# Patient Record
Sex: Female | Born: 1995 | Race: Black or African American | Hispanic: No | Marital: Single
Health system: Midwestern US, Community
[De-identification: ages and names within clinical notes are randomized; demographics above are authoritative.]

## PROBLEM LIST (undated history)

## (undated) ENCOUNTER — Inpatient Hospital Stay (HOSPITAL_COMMUNITY): Payer: Self-pay

## (undated) DIAGNOSIS — K8020 Calculus of gallbladder without cholecystitis without obstruction: Secondary | ICD-10-CM

## (undated) DIAGNOSIS — D649 Anemia, unspecified: Secondary | ICD-10-CM

## (undated) HISTORY — PX: CHOLECYSTECTOMY: SHX55

---

## 2015-09-18 ENCOUNTER — Encounter (HOSPITAL_COMMUNITY): Payer: Self-pay | Admitting: Emergency Medicine

## 2015-09-18 ENCOUNTER — Emergency Department (HOSPITAL_COMMUNITY): Payer: Medicaid Other

## 2015-09-18 ENCOUNTER — Inpatient Hospital Stay (HOSPITAL_COMMUNITY)
Admission: EM | Admit: 2015-09-18 | Discharge: 2015-09-22 | DRG: 446 | Disposition: A | Discharge status: Home / Self Care | Payer: Medicaid Other | Attending: Internal Medicine | Admitting: Internal Medicine

## 2015-09-18 ENCOUNTER — Emergency Department (HOSPITAL_COMMUNITY)
Admission: EM | Admit: 2015-09-18 | Discharge: 2015-09-18 | Disposition: A | Discharge status: Home / Self Care | Payer: Medicaid Other | Source: Home / Self Care | Attending: Emergency Medicine | Admitting: Emergency Medicine

## 2015-09-18 DIAGNOSIS — Z791 Long term (current) use of non-steroidal anti-inflammatories (NSAID): Secondary | ICD-10-CM

## 2015-09-18 DIAGNOSIS — Y92230 Patient room in hospital as the place of occurrence of the external cause: Secondary | ICD-10-CM | POA: Diagnosis present

## 2015-09-18 DIAGNOSIS — R1013 Epigastric pain: Secondary | ICD-10-CM | POA: Insufficient documentation

## 2015-09-18 DIAGNOSIS — D649 Anemia, unspecified: Secondary | ICD-10-CM | POA: Diagnosis present

## 2015-09-18 DIAGNOSIS — R1010 Upper abdominal pain, unspecified: Secondary | ICD-10-CM

## 2015-09-18 DIAGNOSIS — Z6833 Body mass index (BMI) 33.0-33.9, adult: Secondary | ICD-10-CM

## 2015-09-18 DIAGNOSIS — R1011 Right upper quadrant pain: Secondary | ICD-10-CM

## 2015-09-18 DIAGNOSIS — R7989 Other specified abnormal findings of blood chemistry: Secondary | ICD-10-CM | POA: Diagnosis present

## 2015-09-18 DIAGNOSIS — Z9049 Acquired absence of other specified parts of digestive tract: Secondary | ICD-10-CM

## 2015-09-18 DIAGNOSIS — Z79899 Other long term (current) drug therapy: Secondary | ICD-10-CM

## 2015-09-18 DIAGNOSIS — K5900 Constipation, unspecified: Secondary | ICD-10-CM | POA: Diagnosis present

## 2015-09-18 DIAGNOSIS — K831 Obstruction of bile duct: Secondary | ICD-10-CM

## 2015-09-18 DIAGNOSIS — L298 Other pruritus: Secondary | ICD-10-CM | POA: Diagnosis not present

## 2015-09-18 DIAGNOSIS — Z590 Homelessness: Secondary | ICD-10-CM

## 2015-09-18 DIAGNOSIS — K760 Fatty (change of) liver, not elsewhere classified: Secondary | ICD-10-CM | POA: Diagnosis present

## 2015-09-18 DIAGNOSIS — R109 Unspecified abdominal pain: Secondary | ICD-10-CM | POA: Diagnosis present

## 2015-09-18 DIAGNOSIS — R945 Abnormal results of liver function studies: Secondary | ICD-10-CM

## 2015-09-18 DIAGNOSIS — T361X5A Adverse effect of cephalosporins and other beta-lactam antibiotics, initial encounter: Secondary | ICD-10-CM | POA: Diagnosis not present

## 2015-09-18 DIAGNOSIS — Z881 Allergy status to other antibiotic agents status: Secondary | ICD-10-CM

## 2015-09-18 DIAGNOSIS — K8070 Calculus of gallbladder and bile duct without cholecystitis without obstruction: Principal | ICD-10-CM | POA: Diagnosis present

## 2015-09-18 HISTORY — DX: Calculus of gallbladder without cholecystitis without obstruction: K80.20

## 2015-09-18 LAB — CBC WITH DIFFERENTIAL/PLATELET
BASOS ABS: 0 10*3/uL (ref 0.0–0.1)
BASOS PCT: 0 %
Basophils Absolute: 0 10*3/uL (ref 0.0–0.1)
Basophils Relative: 0 %
EOS ABS: 0 10*3/uL (ref 0.0–0.7)
EOS ABS: 0 10*3/uL (ref 0.0–0.7)
EOS PCT: 1 %
Eosinophils Relative: 0 %
HCT: 34.3 % — ABNORMAL LOW (ref 36.0–46.0)
HCT: 33.2 % — ABNORMAL LOW (ref 36.0–46.0)
Hemoglobin: 11 g/dL — ABNORMAL LOW (ref 12.0–15.0)
Hemoglobin: 11 g/dL — ABNORMAL LOW (ref 12.0–15.0)
LYMPHS ABS: 1.3 10*3/uL (ref 0.7–4.0)
LYMPHS PCT: 17 %
Lymphocytes Relative: 17 %
Lymphs Abs: 1 10*3/uL (ref 0.7–4.0)
MCH: 25.9 pg — AB (ref 26.0–34.0)
MCH: 25.9 pg — ABNORMAL LOW (ref 26.0–34.0)
MCHC: 32.1 g/dL (ref 30.0–36.0)
MCHC: 33.1 g/dL (ref 30.0–36.0)
MCV: 80.7 fL (ref 78.0–100.0)
MCV: 78.3 fL (ref 78.0–100.0)
MONO ABS: 0.7 10*3/uL (ref 0.1–1.0)
MONO ABS: 0.3 10*3/uL (ref 0.1–1.0)
MONOS PCT: 9 %
Monocytes Relative: 6 %
NEUTROS ABS: 4.4 10*3/uL (ref 1.7–7.7)
NEUTROS PCT: 77 %
Neutro Abs: 5.7 10*3/uL (ref 1.7–7.7)
Neutrophils Relative %: 73 %
PLATELETS: 381 10*3/uL (ref 150–400)
PLATELETS: 376 10*3/uL (ref 150–400)
RBC: 4.25 MIL/uL (ref 3.87–5.11)
RBC: 4.24 MIL/uL (ref 3.87–5.11)
RDW: 14.2 % (ref 11.5–15.5)
RDW: 14 % (ref 11.5–15.5)
WBC: 7.8 10*3/uL (ref 4.0–10.5)
WBC: 5.7 10*3/uL (ref 4.0–10.5)

## 2015-09-18 LAB — URINE MICROSCOPIC-ADD ON: RBC / HPF: NONE SEEN RBC/hpf (ref 0–5)

## 2015-09-18 LAB — URINALYSIS, ROUTINE W REFLEX MICROSCOPIC
GLUCOSE, UA: NEGATIVE mg/dL
Hgb urine dipstick: NEGATIVE
KETONES UR: NEGATIVE mg/dL
NITRITE: NEGATIVE
PROTEIN: NEGATIVE mg/dL
Specific Gravity, Urine: 1.019 (ref 1.005–1.030)
pH: 7.5 (ref 5.0–8.0)

## 2015-09-18 LAB — LIPASE, BLOOD
LIPASE: 38 U/L (ref 11–51)
LIPASE: 24 U/L (ref 11–51)

## 2015-09-18 LAB — COMPREHENSIVE METABOLIC PANEL
ALBUMIN: 4.4 g/dL (ref 3.5–5.0)
ALT: 206 U/L — ABNORMAL HIGH (ref 14–54)
ALT: 742 U/L — ABNORMAL HIGH (ref 14–54)
ANION GAP: 8 (ref 5–15)
ANION GAP: 7 (ref 5–15)
AST: 407 U/L — ABNORMAL HIGH (ref 15–41)
AST: 771 U/L — AB (ref 15–41)
Albumin: 4.6 g/dL (ref 3.5–5.0)
Alkaline Phosphatase: 87 U/L (ref 38–126)
Alkaline Phosphatase: 114 U/L (ref 38–126)
BUN: 14 mg/dL (ref 6–20)
BUN: 8 mg/dL (ref 6–20)
CHLORIDE: 106 mmol/L (ref 101–111)
CHLORIDE: 108 mmol/L (ref 101–111)
CO2: 23 mmol/L (ref 22–32)
CO2: 21 mmol/L — ABNORMAL LOW (ref 22–32)
Calcium: 8.9 mg/dL (ref 8.9–10.3)
Calcium: 8.8 mg/dL — ABNORMAL LOW (ref 8.9–10.3)
Creatinine, Ser: 0.66 mg/dL (ref 0.44–1.00)
Creatinine, Ser: 0.58 mg/dL (ref 0.44–1.00)
GFR calc Af Amer: >60 mL/min (ref >60)
GFR calc non Af Amer: >60 mL/min (ref >60)
GFR calc non Af Amer: >60 mL/min (ref >60)
GLUCOSE: 88 mg/dL (ref 65–99)
Glucose, Bld: 112 mg/dL — ABNORMAL HIGH (ref 65–99)
POTASSIUM: 3.7 mmol/L (ref 3.5–5.1)
POTASSIUM: 3.7 mmol/L (ref 3.5–5.1)
SODIUM: 137 mmol/L (ref 135–145)
Sodium: 136 mmol/L (ref 135–145)
Total Bilirubin: 0.8 mg/dL (ref 0.3–1.2)
Total Bilirubin: 3.5 mg/dL — ABNORMAL HIGH (ref 0.3–1.2)
Total Protein: 8.2 g/dL — ABNORMAL HIGH (ref 6.5–8.1)
Total Protein: 7.9 g/dL (ref 6.5–8.1)

## 2015-09-18 LAB — I-STAT BETA HCG BLOOD, ED (MC, WL, AP ONLY)

## 2015-09-18 MED ORDER — ONDANSETRON HCL 4 MG/2ML IJ SOLN
4.0000 mg | Freq: Once | INTRAMUSCULAR | Status: AC
  Administered 2015-09-18: 4 mg via INTRAVENOUS
  Filled 2015-09-18: qty 2

## 2015-09-18 MED ORDER — SODIUM CHLORIDE 0.9 % IV BOLUS (SEPSIS)
1000.0000 mL | Freq: Once | INTRAVENOUS | Status: AC
  Administered 2015-09-18: 1000 mL via INTRAVENOUS

## 2015-09-18 MED ORDER — ONDANSETRON HCL 4 MG/2ML IJ SOLN
4.0000 mg | Freq: Four times a day (QID) | INTRAMUSCULAR | Status: DC | PRN
  Administered 2015-09-19 – 2015-09-20 (×3): 4 mg via INTRAVENOUS
  Filled 2015-09-18 (×3): qty 2

## 2015-09-18 MED ORDER — EPINEPHRINE 0.3 MG/0.3ML IJ SOAJ
INTRAMUSCULAR | Status: AC
  Administered 2015-09-18: 0.3 mg
  Filled 2015-09-18: qty 0.3

## 2015-09-18 MED ORDER — FAMOTIDINE IN NACL 20-0.9 MG/50ML-% IV SOLN
20.0000 mg | Freq: Two times a day (BID) | INTRAVENOUS | Status: DC
  Administered 2015-09-18 – 2015-09-22 (×8): 20 mg via INTRAVENOUS
  Filled 2015-09-18 (×9): qty 50

## 2015-09-18 MED ORDER — MORPHINE SULFATE (PF) 2 MG/ML IV SOLN
2.0000 mg | INTRAVENOUS | Status: DC | PRN
  Administered 2015-09-19 (×2): 2 mg via INTRAVENOUS
  Filled 2015-09-18 (×2): qty 1

## 2015-09-18 MED ORDER — RANITIDINE HCL 150 MG PO TABS: 150.0000 mg | ORAL_TABLET | Freq: Two times a day (BID) | ORAL | Status: DC

## 2015-09-18 MED ORDER — DEXTROSE-NACL 5-0.45 % IV SOLN
INTRAVENOUS | Status: DC
  Administered 2015-09-18 – 2015-09-22 (×4): via INTRAVENOUS

## 2015-09-18 MED ORDER — EPINEPHRINE 0.3 MG/0.3ML IJ SOAJ
0.3000 mg | Freq: Once | INTRAMUSCULAR | Status: AC
  Administered 2015-09-18: 0.3 mg via INTRAMUSCULAR

## 2015-09-18 MED ORDER — ONDANSETRON 4 MG PO TBDP: ORAL_TABLET | ORAL | Status: DC

## 2015-09-18 MED ORDER — MORPHINE SULFATE (PF) 4 MG/ML IV SOLN
4.0000 mg | Freq: Once | INTRAVENOUS | Status: AC
  Administered 2015-09-18: 4 mg via INTRAVENOUS
  Filled 2015-09-18: qty 1

## 2015-09-18 MED ORDER — SUCRALFATE 1 G PO TABS: 1.0000 g | ORAL_TABLET | Freq: Four times a day (QID) | ORAL | Status: DC

## 2015-09-18 MED ORDER — IOPAMIDOL (ISOVUE-300) INJECTION 61%
100.0000 mL | Freq: Once | INTRAVENOUS | Status: AC | PRN
Start: 2015-09-18 — End: 2015-09-18
  Administered 2015-09-18: 100 mL via INTRAVENOUS

## 2015-09-18 MED ORDER — LEVOFLOXACIN IN D5W 750 MG/150ML IV SOLN
750.0000 mg | INTRAVENOUS | Status: DC
  Administered 2015-09-19 – 2015-09-20 (×2): 750 mg via INTRAVENOUS
  Filled 2015-09-18 (×2): qty 150

## 2015-09-18 MED ORDER — GI COCKTAIL ~~LOC~~
30.0000 mL | Freq: Once | ORAL | Status: AC
  Administered 2015-09-18: 30 mL via ORAL
  Filled 2015-09-18: qty 30

## 2015-09-18 MED ORDER — DEXTROSE 5 % IV SOLN
1.0000 g | Freq: Once | INTRAVENOUS | Status: AC
  Administered 2015-09-18: 1 g via INTRAVENOUS
  Filled 2015-09-18: qty 10

## 2015-09-18 MED ORDER — ENOXAPARIN SODIUM 40 MG/0.4ML ~~LOC~~ SOLN
40.0000 mg | Freq: Every day | SUBCUTANEOUS | Status: DC
  Administered 2015-09-18 – 2015-09-19 (×2): 40 mg via SUBCUTANEOUS
  Filled 2015-09-18 (×3): qty 0.4

## 2015-09-18 MED ORDER — DIPHENHYDRAMINE HCL 50 MG/ML IJ SOLN
INTRAMUSCULAR | Status: AC
  Administered 2015-09-18: 25 mg
  Filled 2015-09-18: qty 1

## 2015-09-18 MED ORDER — METHYLPREDNISOLONE SODIUM SUCC 125 MG IJ SOLR
125.0000 mg | Freq: Once | INTRAMUSCULAR | Status: AC
  Administered 2015-09-18: 125 mg via INTRAVENOUS
  Filled 2015-09-18: qty 2

## 2015-09-18 MED ORDER — SODIUM CHLORIDE 0.9 % IV SOLN: Freq: Once | INTRAVENOUS | Status: DC

## 2015-09-18 MED ORDER — MORPHINE SULFATE (PF) 4 MG/ML IV SOLN
8.0000 mg | Freq: Once | INTRAVENOUS | Status: AC
  Administered 2015-09-18: 8 mg via INTRAVENOUS
  Filled 2015-09-18: qty 2

## 2015-09-18 MED ORDER — DIATRIZOATE MEGLUMINE & SODIUM 66-10 % PO SOLN
15.0000 mL | Freq: Once | ORAL | Status: AC
  Administered 2015-09-18: 15 mL via ORAL

## 2015-09-18 MED ORDER — FAMOTIDINE IN NACL 20-0.9 MG/50ML-% IV SOLN
20.0000 mg | Freq: Once | INTRAVENOUS | Status: AC
  Administered 2015-09-18: 20 mg via INTRAVENOUS
  Filled 2015-09-18: qty 50

## 2015-09-18 NOTE — ED Notes (Signed)
AD at bedside. Pt still refusing to leave at this time. Pt homeless and concerned about having no place to go. Pt given prescription for zantac per request. Social work consulted.

## 2015-09-18 NOTE — ED Notes (Addendum)
Pt transported from relatives home by EMS with c/o epigastric pain and n/v. Per EMS pt states last time this happened she has gall bladder removed  Pt awoke at 3 with pain radiating to back 10/10, pt has husband and small child in carrier at bedside

## 2015-09-18 NOTE — Progress Notes (Signed)
Pharmacy Antibiotic Note  Margaret Jones is a 20 y.o. female admitted on 09/18/2015 with RUQ/epigastric pain with N/V. Patient received Ceftriaxone 1gm IV x 1 dose in ED.  Pharmacy has been consulted for Levofloxacin dosing for possible cholangitis.  Plan for GI eval in the AM  Plan: Levofloxacin 750mg  IV q24h  Height: 5\' 2"  (157.5 cm) Weight: 183 lb (83.008 kg) IBW/kg (Calculated) : 50.1  Temp (24hrs), Avg:98.3 F (36.8 C), Min:97.9 F (36.6 C), Max:98.9 F (37.2 C)   Recent Labs Lab 09/18/15 0509 09/18/15 2025  WBC 7.8 5.7  CREATININE 0.66 0.58    Estimated Creatinine Clearance: 110.2 mL/min (by C-G formula based on Cr of 0.58).    Allergies  Allergen Reactions  . Rocephin [Ceftriaxone Sodium In Dextrose] Anaphylaxis    Given Epi 0.3mg     Antimicrobials this admission: 6/19 Ceftriaxone x 1 dose 6/19 Levofloxacin >>    Dose adjustments this admission:    Microbiology results:   BCx:     UCx:      Sputum:      MRSA PCR:    Thank you for allowing pharmacy to be a part of this patient's care.  Maryellen PilePoindexter, Borghild Thaker Trefz, PharmD 09/18/2015 11:25 PM

## 2015-09-18 NOTE — ED Notes (Addendum)
Pt reminded of urine sample, was given pain medicine, would like to wait due to effects of the medicine

## 2015-09-18 NOTE — Discharge Instructions (Signed)
Try zantac 150mg  twice a day.  Follow up with GI for liver enzyme elevation.  Abdominal Pain, Adult Many things can cause abdominal pain. Usually, abdominal pain is not caused by a disease and will improve without treatment. It can often be observed and treated at home. Your health care provider will do a physical exam and possibly order blood tests and X-rays to help determine the seriousness of your pain. However, in many cases, more time must pass before a clear cause of the pain can be found. Before that point, your health care provider may not know if you need more testing or further treatment. HOME CARE INSTRUCTIONS Monitor your abdominal pain for any changes. The following actions may help to alleviate any discomfort you are experiencing:  Only take over-the-counter or prescription medicines as directed by your health care provider.  Do not take laxatives unless directed to do so by your health care provider.  Try a clear liquid diet (broth, tea, or water) as directed by your health care provider. Slowly move to a bland diet as tolerated. SEEK MEDICAL CARE IF:  You have unexplained abdominal pain.  You have abdominal pain associated with nausea or diarrhea.  You have pain when you urinate or have a bowel movement.  You experience abdominal pain that wakes you in the night.  You have abdominal pain that is worsened or improved by eating food.  You have abdominal pain that is worsened with eating fatty foods.  You have a fever. SEEK IMMEDIATE MEDICAL CARE IF:  Your pain does not go away within 2 hours.  You keep throwing up (vomiting).  Your pain is felt only in portions of the abdomen, such as the right side or the left lower portion of the abdomen.  You pass bloody or black tarry stools. MAKE SURE YOU:  Understand these instructions.  Will watch your condition.  Will get help right away if you are not doing well or get worse.   This information is not intended to  replace advice given to you by your health care provider. Make sure you discuss any questions you have with your health care provider.   Document Released: 12/26/2004 Document Revised: 12/07/2014 Document Reviewed: 11/25/2012 Elsevier Interactive Patient Education [employer]2016 Elsevier Inc.

## 2015-09-18 NOTE — ED Notes (Signed)
Pt now requesting social work consult.

## 2015-09-18 NOTE — ED Notes (Signed)
Attempted an IV x 2 but was unsuccessful. Will await for another to attempt.

## 2015-09-18 NOTE — ED Notes (Signed)
EDP at bedside  

## 2015-09-18 NOTE — ED Provider Notes (Addendum)
CSN: [id number]     Arrival date & time 09/18/15  1841 History   First MD Initiated Contact with Patient 09/18/15 1948     Chief Complaint  Patient presents with  . Abdominal Pain     (Consider location/radiation/quality/duration/timing/severity/associated sxs/prior Treatment) HPI  20 year old female presents with upper abdominal pain, nausea, and vomiting. Started around 3 AM. She's had pain like this on and off for the last several months. [place] in [place]. Recently moved down to this area and had her gallbladder taken out about 1-1/2 months ago. This was taken around [place]. Has not had any pain since until this morning. Was seen here, had labs, given morphine, and had a negative right upper quadrant ultrasound. Had some LFT elevation. Denies alcohol use. Patient states that she still in severe pain and is still vomiting. No diarrhea. Last bowel movement was yesterday and normal. No urinary symptoms. Pain radiates to her back. Called referral GI but can't be seen until August.   Past Medical History  Diagnosis Date  . Gall stones    Past Surgical History  Procedure Laterality Date  . Cholecystectomy    . Cesarean section     No family history on file. Social History  Substance Use Topics  . Smoking status: Never Smoker   . Smokeless tobacco: None  . Alcohol Use: No   OB History    No data available     Review of Systems  Constitutional: Negative for fever.  Gastrointestinal: Positive for nausea, vomiting and abdominal pain. Negative for diarrhea, constipation and blood in stool.  Genitourinary: Negative for dysuria and hematuria.  Musculoskeletal: Positive for back pain.  All other systems reviewed and are negative.     Allergies  Review of patient's allergies indicates no known allergies.  Home Medications   Prior to Admission medications   Medication Sig Start Date End Date Taking? Authorizing Provider  ibuprofen (ADVIL,MOTRIN) 400  MG tablet Take 400 mg by mouth every 6 (six) hours as needed for mild pain.    Historical Provider, MD  ondansetron (ZOFRAN ODT) 4 MG disintegrating tablet 4mg  ODT q4 hours prn nausea/vomit 09/18/15   Melene Planan Floyd, DO  ranitidine (ZANTAC) 150 MG tablet Take 1 tablet (150 mg total) by mouth 2 (two) times daily. 09/18/15   Lorre NickAnthony Allen, MD  sucralfate (CARAFATE) 1 g tablet Take 1 tablet (1 g total) by mouth 4 (four) times daily. 09/18/15   Lorre NickAnthony Allen, MD   BP 111/67 mmHg  Pulse 66  Temp(Src) 97.9 F (36.6 C) (Oral)  Resp 18  Ht 5\' 2"  (1.575 m)  Wt 183 lb (83.008 kg)  BMI 33.46 kg/m2  SpO2 100%  LMP 08/30/2015 Physical Exam  Constitutional: She is oriented to person, place, and time. She appears well-developed and well-nourished. No distress.  HENT:  Head: Normocephalic and atraumatic.  Right Ear: External ear normal.  Left Ear: External ear normal.  Nose: Nose normal.  Eyes: Right eye exhibits no discharge. Left eye exhibits no discharge.  Cardiovascular: Normal rate, regular rhythm and normal heart sounds.   Pulmonary/Chest: Effort normal and breath sounds normal.  Abdominal: Soft. There is tenderness in the right upper quadrant, epigastric area and left upper quadrant.  Neurological: She is alert and oriented to person, place, and time.  Skin: Skin is warm and dry. She is not diaphoretic.  Nursing note and vitals reviewed.   ED Course  Procedures (including critical care time) Labs Review Labs Reviewed  COMPREHENSIVE METABOLIC  PANEL - Abnormal; Notable for the following:    CO2 21 (*)    Calcium 8.8 (*)    AST 771 (*)    ALT 742 (*)    Total Bilirubin 3.5 (*)    All other components within normal limits  CBC WITH DIFFERENTIAL/PLATELET - Abnormal; Notable for the following:    Hemoglobin 11.0 (*)    HCT 33.2 (*)    MCH 25.9 (*)    All other components within normal limits  URINALYSIS, ROUTINE W REFLEX MICROSCOPIC (NOT AT [facility]) - Abnormal; Notable for the following:     Color, Urine AMBER (*)    APPearance CLOUDY (*)    Bilirubin Urine SMALL (*)    Leukocytes, UA SMALL (*)    All other components within normal limits  URINE MICROSCOPIC-ADD ON - Abnormal; Notable for the following:    Squamous Epithelial / LPF 0-5 (*)    Bacteria, UA FEW (*)    All other components within normal limits  LIPASE, BLOOD    Imaging Review Ct Abdomen Pelvis W Contrast  09/18/2015  CLINICAL DATA:  Awoke at 3 p.m. with epigastric pain, pain radiating to back rated at 10/10, upper abdominal pain, nausea, vomiting, prior cholecystectomy 2 months ago, initial encounter EXAM: CT ABDOMEN AND PELVIS WITH CONTRAST TECHNIQUE: Multidetector CT imaging of the abdomen and pelvis was performed using the standard protocol following bolus administration of intravenous contrast. Sagittal and coronal MPR images reconstructed from axial data set. CONTRAST:  ISOVUE-300 IOPAMIDOL (ISOVUE-300) INJECTION 61% IV. Dilute oral contrast. COMPARISON:  None; correlation ultrasound abdomen 09/18/2015 FINDINGS: Lower chest:  Lung bases clear Hepatobiliary: Diffuse fatty infiltration of liver. Post cholecystectomy. Mildly prominent CBD and central intrahepatic biliary radicles, CBD 7 mm proximally and 8 mm distal, question due to cholecystectomy. No definite CBD stone by CT. No abnormal fluid collection at gallbladder fossa. Pancreas: Normal appearance Spleen: Normal appearance Adrenals/Urinary Tract: Adrenal glands normal appearance. Kidneys normal appearance. Ureters normal appearance. Bladder wall appears thickened though bladder is underdistended at least in part artifactual. Stomach/Bowel: Normal appendix. Stomach and bowel loops normal appearance. Vascular/Lymphatic: Vascular structures unremarkable. No adenopathy. Reproductive: Uterus and LEFT adnexa normal appearance. Tiny cyst RIGHT ovary. Small amount of nonspecific low-attenuation free pelvic fluid. Other: No mass, free air, or hernia. Musculoskeletal:  Bones unremarkable. IMPRESSION: Diffuse fatty infiltration of liver. Mild extrahepatic and central intrahepatic biliary dilatation post cholecystectomy, recommend correlation with LFTs. Thickened bladder wall, likely at least partly due to underdistention though cannot exclude other causes of bladder wall thickening including infection and chronic outlet obstruction, tumor unlikely ; recommend correlation with urinalysis. Small amount of free pelvic fluid of uncertain etiology. Electronically Signed   By: Ulyses Southward M.D.   On: 09/18/2015 21:39   US Abdomen Limited Ruq  09/18/2015  CLINICAL DATA:  Chest pain. EXAM: US ABDOMEN LIMITED - RIGHT UPPER QUADRANT COMPARISON:  No prior. FINDINGS: Gallbladder: Cholecystectomy. Common bile duct: Diameter: 6.8 mm. Liver: Liver is echogenic consistent fatty infiltration and/or hepatocellular disease. IMPRESSION: 1. Cholecystectomy.  No biliary distention. 2. Echogenic liver consistent with fatty infiltration and/or hepatocellular disease. Electronically Signed   By: Maisie Fus  Register   On: 09/18/2015 07:38   I have personally reviewed and evaluated these images and lab results as part of my medical decision-making.   EKG Interpretation None      MDM   Final diagnoses:  Upper abdominal pain  Abnormal LFTs    Patient's LFTs are worsening and now her bilirubin is at  3.5. CT shows mildly dilated CBD and intrahepatic ducts. Pain better with IV morphine. Discussed with Dr. Evette Cristal of GI who recommends admission to medicine and MRCP in the morning. Cover with Rocephin. If there is a common bile duct stone likely will need EUS or ERCP. Patient is currently stable and does not appear septic. Admit to the hospitalist.    Pricilla Loveless, MD 09/18/15 2243  Of note patient started developing itching, periorbital swelling, nasal congestion, and throat itching shortly after starting Rocephin. She has never had rocephin for allergy documented before. She was given  Benadryl but also epinephrine given the throat symptoms. She is feeling better and the itching and swelling is improving. Hospitalist at bedside and aware.  Pricilla Loveless, MD 09/18/15 [phone]

## 2015-09-18 NOTE — Progress Notes (Signed)
Nurse spoke with CSW regarding patient needing assistance with shelter. Nurse asked for a shelter list to provide to patient. Nurse stated patient has stated [facility]. Nurse was given shelter list.  Elenore PaddyLaVonia Huma Imhoff, LCSWA [phone] ED CSW 09/18/2015 10:04 AM

## 2015-09-18 NOTE — ED Provider Notes (Signed)
Patient signed out to me by Dr. Adela LankFloyd and ultrasound results reviewed with patient and she was instructed to follow-up with her gastroenterologist  Margaret NickAnthony [name], MD 09/18/15 [phone]

## 2015-09-18 NOTE — H&P (Signed)
History and Physical  Margaret Jones ZOX:[id number] DOB: [birth year] DOA: 09/18/2015  PCP:  No PCP Per Patient   Chief Complaint:  Abdominal pain   History of Present Illness:  Patient is a 20 yo female with history of cholecystectomy 3 months ago comes with cc of abdominal pain in epigastric/RUQ area, radiating to her chest sometimes, associated with nausea and vomiting started earlier this morning with no diarrhea, fever or chills. She has no dyspnea or cough. No other complaints.   Review of Systems:  CONSTITUTIONAL:     No night sweats.  No fatigue.  No fever. No chills. Eyes:                            No visual changes.  No eye pain.  No eye discharge.   ENT:                              No epistaxis.  No sinus pain.  No sore throat.   No congestion. RESPIRATORY:           No cough.  No wheeze.  No hemoptysis.  No dyspnea CARDIOVASCULAR   :  +chest pains.  No palpitations. GASTROINTESTINAL:  +abdominal pain.  +nausea. +vomiting.  No diarrhea. No constipation.  No hematemesis.  No hematochezia.  No melena. GENITOURINARY:      No urgency.  No frequency.  No dysuria.  No hematuria.  No  obstructive symptoms.  No discharge.  No pain.  MUSCULOSKELETAL:  No musculoskeletal pain.  No joint swelling.  No arthritis. NEUROLOGICAL:        No confusion.  No weakness. No headache. No seizure. PSYCHIATRIC:             No depression. No anxiety. No suicidal ideation. SKIN:                             No rashes.  No lesions.  No wounds. ENDOCRINE:                No weight loss.  No polydipsia.  No polyuria.  No polyphagia. HEMATOLOGIC:           No purpura.  No petechiae.  No bleeding.  ALLERGIC                 : No pruritus.  No angioedema Other:  Past Medical and Surgical History:   Past Medical History  Diagnosis Date  . Gall stones    Past Surgical History  Procedure Laterality Date  . Cholecystectomy    . Cesarean section      Social History:   reports that she has never  smoked. She does not have any smokeless tobacco history on file. She reports that she does not drink alcohol or use illicit drugs.    Allergies  Allergen Reactions  . Rocephin [Ceftriaxone Sodium In Dextrose] Anaphylaxis    Given Epi 0.3mg     No family history on file.    Prior to Admission medications   Medication Sig Start Date End Date Taking? Authorizing Provider  ibuprofen (ADVIL,MOTRIN) 400 MG tablet Take 400 mg by mouth every 6 (six) hours as needed for mild pain.    Historical Provider, MD  ondansetron (ZOFRAN ODT) 4 MG disintegrating tablet 4mg  ODT q4 hours prn nausea/vomit 09/18/15   Melene Planan Floyd, DO  ranitidine (  ZANTAC) 150 MG tablet Take 1 tablet (150 mg total) by mouth 2 (two) times daily. 09/18/15   Lorre Nick, MD  sucralfate (CARAFATE) 1 g tablet Take 1 tablet (1 g total) by mouth 4 (four) times daily. 09/18/15   Lorre Nick, MD    Physical Exam: BP 115/58 mmHg  Pulse 70  Temp(Src) 98 F (36.7 C) (Oral)  Resp 18  Ht 5\' 2"  (1.575 m)  Wt 83.008 kg (183 lb)  BMI 33.46 kg/m2  SpO2 100%  LMP 08/30/2015  GENERAL :   Alert and cooperative, and appears to be in no acute distress. HEAD:           normocephalic. EYES:            PERRL, EOMI.  vision is grossly intact. EARS:           hearing grossly intact. NOSE:           No nasal discharge. THROAT:     Oral cavity and pharynx normal.   NECK:          supple, non-tender.  CARDIAC:    Normal S1 and S2. No gallop. No murmurs.  Vascular:     no peripheral edema.  LUNGS:       Clear to auscultation  ABDOMEN: Positive bowel sounds. Soft, nondistended, RUQ/Epigastric tenderness with palpation. No guarding or rebound.      MSK:           No joint erythema or tenderness. Normal muscular development. EXT           : No significant deformity or joint abnormality. Neuro        : Alert, oriented to person, place, and time.                      CN II-XII intact.                       Strength and sensation symmetric and intact  throughout.                       SKIN:            No rash. No lesions. PSYCH:       No hallucination. Patient is not suicidal.          Labs on Admission:  Reviewed.   Radiological Exams on Admission: Ct Abdomen Pelvis W Contrast  09/18/2015  CLINICAL DATA:  Awoke at 3 p.m. with epigastric pain, pain radiating to back rated at 10/10, upper abdominal pain, nausea, vomiting, prior cholecystectomy 2 months ago, initial encounter EXAM: CT ABDOMEN AND PELVIS WITH CONTRAST TECHNIQUE: Multidetector CT imaging of the abdomen and pelvis was performed using the standard protocol following bolus administration of intravenous contrast. Sagittal and coronal MPR images reconstructed from axial data set. CONTRAST:  ISOVUE-300 IOPAMIDOL (ISOVUE-300) INJECTION 61% IV. Dilute oral contrast. COMPARISON:  None; correlation ultrasound abdomen 09/18/2015 FINDINGS: Lower chest:  Lung bases clear Hepatobiliary: Diffuse fatty infiltration of liver. Post cholecystectomy. Mildly prominent CBD and central intrahepatic biliary radicles, CBD 7 mm proximally and 8 mm distal, question due to cholecystectomy. No definite CBD stone by CT. No abnormal fluid collection at gallbladder fossa. Pancreas: Normal appearance Spleen: Normal appearance Adrenals/Urinary Tract: Adrenal glands normal appearance. Kidneys normal appearance. Ureters normal appearance. Bladder wall appears thickened though bladder is underdistended at least in part artifactual. Stomach/Bowel: Normal appendix. Stomach and bowel loops normal  appearance. Vascular/Lymphatic: Vascular structures unremarkable. No adenopathy. Reproductive: Uterus and LEFT adnexa normal appearance. Tiny cyst RIGHT ovary. Small amount of nonspecific low-attenuation free pelvic fluid. Other: No mass, free air, or hernia. Musculoskeletal: Bones unremarkable. IMPRESSION: Diffuse fatty infiltration of liver. Mild extrahepatic and central intrahepatic biliary dilatation post cholecystectomy,  recommend correlation with LFTs. Thickened bladder wall, likely at least partly due to underdistention though cannot exclude other causes of bladder wall thickening including infection and chronic outlet obstruction, tumor unlikely ; recommend correlation with urinalysis. Small amount of free pelvic fluid of uncertain etiology. Electronically Signed   By: Ulyses Southward M.D.   On: 09/18/2015 21:39   US Abdomen Limited Ruq  09/18/2015  CLINICAL DATA:  Chest pain. EXAM: US ABDOMEN LIMITED - RIGHT UPPER QUADRANT COMPARISON:  No prior. FINDINGS: Gallbladder: Cholecystectomy. Common bile duct: Diameter: 6.8 mm. Liver: Liver is echogenic consistent fatty infiltration and/or hepatocellular disease. IMPRESSION: 1. Cholecystectomy.  No biliary distention. 2. Echogenic liver consistent with fatty infiltration and/or hepatocellular disease. Electronically Signed   By: Maisie Fus  Register   On: 09/18/2015 07:38      Assessment/Plan  RUQ/Epigastric pain/nausea/vomiting/elevated liver enzymes/hyperbilirubinemia: Unclear etiology, could be due to mass/stricture/gallstones/billiary sludge Will check MRCP and consult GI in am Keep NPO, morphine prn, zofran prn, pepcid IV, IVF Will start on levoquin for possible cholangitis although unlikely awaiting MRI /GI eval.     Input & Output: NA Lines & Tubes: PIV DVT prophylaxis: [place] enoxaparin  GI prophylaxis: Pepcid Consultants: GI Code Status: Full Family Communication: none at bedside  Disposition Plan: Obs    Eston Esters M.D [facility]

## 2015-09-18 NOTE — ED Provider Notes (Signed)
CSN: [id number]     Arrival date & time 09/18/15  [id number] History   First MD Initiated Contact with Patient 09/18/15 [phone]     Chief Complaint  Patient presents with  . Emesis     (Consider location/radiation/quality/duration/timing/severity/associated sxs/prior Treatment) Patient is a 20 y.o. female presenting with vomiting.  Emesis Severity:  Moderate Duration:  4 hours Timing:  Constant Progression:  Worsening Chronicity:  New Recent urination:  Normal Relieved by:  Nothing Worsened by:  Nothing tried Ineffective treatments:  None tried Associated symptoms: abdominal pain   Associated symptoms: no arthralgias, no chills, no headaches and no myalgias    20 yo F With a chief complaint of Epigastric abdominal pain. The started abruptly about 4 hours ago. Patient having some vomiting. She thinks this feels similar to when she had gallstones. She had her gallbladder removed and had cessation of the symptoms. However they've recurred this evening. Unsure what makes it better or worse. Denies fevers or chills. Denies diarrhea.   Past Medical History  Diagnosis Date  . Gall stones    Past Surgical History  Procedure Laterality Date  . Cholecystectomy    . Cesarean section     No family history on file. Social History  Substance Use Topics  . Smoking status: Never Smoker   . Smokeless tobacco: None  . Alcohol Use: No   OB History    No data available     Review of Systems  Constitutional: Negative for fever and chills.  HENT: Negative for congestion and rhinorrhea.   Eyes: Negative for redness and visual disturbance.  Respiratory: Negative for shortness of breath and wheezing.   Cardiovascular: Negative for chest pain and palpitations.  Gastrointestinal: Positive for nausea, vomiting and abdominal pain.  Genitourinary: Negative for dysuria and urgency.  Musculoskeletal: Negative for myalgias and arthralgias.  Skin: Negative for pallor and wound.  Neurological: Negative  for dizziness and headaches.      Allergies  Review of patient's allergies indicates no known allergies.  Home Medications   Prior to Admission medications   Medication Sig Start Date End Date Taking? Authorizing Provider  ibuprofen (ADVIL,MOTRIN) 400 MG tablet Take 400 mg by mouth every 6 (six) hours as needed for mild pain.   Yes Historical Provider, MD  ondansetron (ZOFRAN ODT) 4 MG disintegrating tablet 4mg  ODT q4 hours prn nausea/vomit 09/18/15   Melene Planan Zolton Dowson, DO   BP 113/70 mmHg  Pulse 60  Temp(Src) 98.9 F (37.2 C) (Oral)  Resp 15  Ht 5\' 2"  (1.575 m)  Wt 183 lb (83.008 kg)  BMI 33.46 kg/m2  SpO2 100%  LMP 08/30/2015 Physical Exam  Constitutional: She is oriented to person, place, and time. She appears well-developed and well-nourished. No distress.  HENT:  Head: Normocephalic and atraumatic.  Eyes: EOM are normal. Pupils are equal, round, and reactive to light.  Neck: Normal range of motion. Neck supple.  Cardiovascular: Normal rate and regular rhythm.  Exam reveals no gallop and no friction rub.   No murmur heard. Pulmonary/Chest: Effort normal. She has no wheezes. She has no rales.  Abdominal: Soft. She exhibits no distension. There is tenderness (worst in the epigatrium). There is no rebound and no guarding.  Musculoskeletal: She exhibits no edema or tenderness.  Neurological: She is alert and oriented to person, place, and time.  Skin: Skin is warm and dry. She is not diaphoretic.  Psychiatric: She has a normal mood and affect. Her behavior is normal.  Nursing note and vitals  reviewed.   ED Course  Procedures (including critical care time) Labs Review Labs Reviewed  CBC WITH DIFFERENTIAL/PLATELET - Abnormal; Notable for the following:    Hemoglobin 11.0 (*)    HCT 34.3 (*)    MCH 25.9 (*)    All other components within normal limits  COMPREHENSIVE METABOLIC PANEL - Abnormal; Notable for the following:    Glucose, Bld 112 (*)    Total Protein 8.2 (*)    AST  407 (*)    ALT 206 (*)    All other components within normal limits  LIPASE, BLOOD  URINALYSIS, ROUTINE W REFLEX MICROSCOPIC (NOT AT [facility])  I-STAT BETA HCG BLOOD, ED ([facility], [facility], [facility] ONLY)    Imaging Review No results found. I have personally reviewed and evaluated these images and lab results as part of my medical decision-making.   EKG Interpretation None      MDM   Final diagnoses:  Epigastric pain  RUQ abdominal pain    20 yo F With a chief complaint of nausea and vomiting and epigastric abdominal pain. Will obtain a laboratory evaluation. Treat pain and nausea.   Patient with LFT elevation. Symptoms completely relieved with GI cocktail. With LFT elevation will obtain a right upper quadrant ultrasound.  Old records reviewed. Patient had her gallbladder removed about 2 months ago. This was at [facility]. At times she also had elevated LFTs. Mild common bile duct dilation. At that time the LFTs trended downward and she was discharged home.  I feel if the [place] is negative and symptoms well controlled, will have her follow up with GI as an outpatient for possible further imaging.   Turned over to Dr. Freida Busman.  The patients results and plan were reviewed and discussed.   Any x-rays performed were independently reviewed by myself.   Differential diagnosis were considered with the presenting HPI.  Medications  sodium chloride 0.9 % bolus 1,000 mL (0 mLs Intravenous Stopped 09/18/15 0652)  ondansetron (ZOFRAN) injection 4 mg (4 mg Intravenous Given 09/18/15 0541)  morphine 4 MG/ML injection 4 mg (4 mg Intravenous Given 09/18/15 0540)  gi cocktail (Maalox,Lidocaine,Donnatal) (30 mLs Oral Given 09/18/15 0616)    Filed Vitals:   09/18/15 0405 09/18/15 0634  BP: 115/76 113/70  Pulse: 79 60  Temp: 98.9 F (37.2 C)   TempSrc: Oral   Resp:  15  Height:  (1.575 m)   Weight: 183 lb (83.008 kg)   SpO2: 98% 100%    Final diagnoses:  Epigastric pain  RUQ abdominal  pain       Melene Plan, DO 09/18/15 1610

## 2015-09-18 NOTE — ED Notes (Signed)
Per EMS was here for the same symptoms this am-was given Morphine and was not given amy discharge meds-had gallbladder out 2 months ago

## 2015-09-18 NOTE — ED Notes (Addendum)
Pt refusing to be d/c, sts "I will be back later tonight because you haven't fixed anything."  This RN sat down and spoke with pt. About concerns. Pt stated that she has had this stomach pain, n/v for 6 months and has been to multiple EDs and no one has fixed her problem.  This RN explained that she needs to follow up with her GI doctor whose information is on d/c paperwork. RN explained limitation of the ER as far as being able to adjust medications and follow up with pt. Pt expressed understanding but was still frustrated that she would still be in pain when she got home. Pt denies pain at this time but sts "I know that the pain will be back later and I'm just going to come back here." Pt told this RN she would call GI doctor at this time and make an appointment for today.

## 2015-09-18 NOTE — Progress Notes (Signed)
EDCM spoke to patient at bedside. Patient confirms she does not have a pcp or [place].  [facility], informed patient of services there and walk in times.  EDCM also provided patient with list of pcps who accept self pay patients, list of discount pharmacies and websites needymeds.org and GoodRX.com for medication assistance, phone number to inquire about the orange card, phone number to inquire about Medicaid, phone number to inquire about the Affordable Care Act, financial resources in the community such as local churches, salvation [employer], urban ministries, and dental assistance for uninsured patients.  Patient thankful for resources.  No further EDCM needs at this time.  Patient reports she has just moved to [place].  Patient reports she has applied for Medicaid and is in progress.  [facility] provided patient with phone number to DSS to follow up on application.  [facility] encouraged [facility] to establish care using resources provided.

## 2015-09-19 ENCOUNTER — Observation Stay (HOSPITAL_COMMUNITY): Payer: Medicaid Other

## 2015-09-19 DIAGNOSIS — T361X5A Adverse effect of cephalosporins and other beta-lactam antibiotics, initial encounter: Secondary | ICD-10-CM | POA: Diagnosis not present

## 2015-09-19 DIAGNOSIS — K760 Fatty (change of) liver, not elsewhere classified: Secondary | ICD-10-CM

## 2015-09-19 DIAGNOSIS — Z9049 Acquired absence of other specified parts of digestive tract: Secondary | ICD-10-CM | POA: Diagnosis not present

## 2015-09-19 DIAGNOSIS — R1011 Right upper quadrant pain: Secondary | ICD-10-CM | POA: Diagnosis not present

## 2015-09-19 DIAGNOSIS — Z881 Allergy status to other antibiotic agents status: Secondary | ICD-10-CM | POA: Diagnosis not present

## 2015-09-19 DIAGNOSIS — R945 Abnormal results of liver function studies: Secondary | ICD-10-CM | POA: Diagnosis present

## 2015-09-19 DIAGNOSIS — R1013 Epigastric pain: Secondary | ICD-10-CM | POA: Diagnosis present

## 2015-09-19 DIAGNOSIS — Z79899 Other long term (current) drug therapy: Secondary | ICD-10-CM | POA: Diagnosis not present

## 2015-09-19 DIAGNOSIS — K5900 Constipation, unspecified: Secondary | ICD-10-CM | POA: Diagnosis present

## 2015-09-19 DIAGNOSIS — L298 Other pruritus: Secondary | ICD-10-CM | POA: Diagnosis not present

## 2015-09-19 DIAGNOSIS — R7989 Other specified abnormal findings of blood chemistry: Secondary | ICD-10-CM | POA: Diagnosis not present

## 2015-09-19 DIAGNOSIS — D649 Anemia, unspecified: Secondary | ICD-10-CM | POA: Diagnosis present

## 2015-09-19 DIAGNOSIS — K8070 Calculus of gallbladder and bile duct without cholecystitis without obstruction: Secondary | ICD-10-CM | POA: Diagnosis present

## 2015-09-19 DIAGNOSIS — Y92230 Patient room in hospital as the place of occurrence of the external cause: Secondary | ICD-10-CM | POA: Diagnosis present

## 2015-09-19 DIAGNOSIS — K8050 Calculus of bile duct without cholangitis or cholecystitis without obstruction: Secondary | ICD-10-CM | POA: Diagnosis not present

## 2015-09-19 DIAGNOSIS — Z590 Homelessness: Secondary | ICD-10-CM | POA: Diagnosis not present

## 2015-09-19 DIAGNOSIS — Z6833 Body mass index (BMI) 33.0-33.9, adult: Secondary | ICD-10-CM | POA: Diagnosis not present

## 2015-09-19 LAB — COMPREHENSIVE METABOLIC PANEL
ALK PHOS: 126 U/L (ref 38–126)
ALT: 787 U/L — AB (ref 14–54)
AST: 630 U/L — ABNORMAL HIGH (ref 15–41)
Albumin: 4.3 g/dL (ref 3.5–5.0)
Anion gap: 7 (ref 5–15)
BUN: 6 mg/dL (ref 6–20)
CALCIUM: 8.8 mg/dL — AB (ref 8.9–10.3)
CO2: 21 mmol/L — ABNORMAL LOW (ref 22–32)
CREATININE: 0.59 mg/dL (ref 0.44–1.00)
Chloride: 107 mmol/L (ref 101–111)
Glucose, Bld: 179 mg/dL — ABNORMAL HIGH (ref 65–99)
Potassium: 4 mmol/L (ref 3.5–5.1)
Sodium: 135 mmol/L (ref 135–145)
Total Bilirubin: 4.1 mg/dL — ABNORMAL HIGH (ref 0.3–1.2)
Total Protein: 7.8 g/dL (ref 6.5–8.1)

## 2015-09-19 LAB — GLUCOSE, CAPILLARY: GLUCOSE-CAPILLARY: 149 mg/dL — AB (ref 65–99)

## 2015-09-19 LAB — CBC
HCT: 33.6 % — ABNORMAL LOW (ref 36.0–46.0)
Hemoglobin: 10.9 g/dL — ABNORMAL LOW (ref 12.0–15.0)
MCH: 25.3 pg — ABNORMAL LOW (ref 26.0–34.0)
MCHC: 32.4 g/dL (ref 30.0–36.0)
MCV: 78 fL (ref 78.0–100.0)
PLATELETS: 340 10*3/uL (ref 150–400)
RBC: 4.31 MIL/uL (ref 3.87–5.11)
RDW: 14 % (ref 11.5–15.5)
WBC: 4.8 10*3/uL (ref 4.0–10.5)

## 2015-09-19 LAB — PROTIME-INR
INR: 1.13 (ref 0.00–1.49)
PROTHROMBIN TIME: 14.3 s (ref 11.6–15.2)

## 2015-09-19 MED ORDER — GADOBENATE DIMEGLUMINE 529 MG/ML IV SOLN
20.0000 mL | Freq: Once | INTRAVENOUS | Status: AC | PRN
  Administered 2015-09-19: 20 mL via INTRAVENOUS

## 2015-09-19 MED ORDER — FENTANYL CITRATE (PF) 100 MCG/2ML IJ SOLN
25.0000 ug | INTRAMUSCULAR | Status: DC | PRN
  Administered 2015-09-19 – 2015-09-20 (×3): 25 ug via INTRAVENOUS
  Filled 2015-09-19 (×3): qty 2

## 2015-09-19 MED ORDER — OXYCODONE HCL 5 MG PO TABS
10.0000 mg | ORAL_TABLET | Freq: Once | ORAL | Status: AC
  Administered 2015-09-19: 10 mg via ORAL
  Filled 2015-09-19: qty 2

## 2015-09-19 NOTE — Progress Notes (Addendum)
PROGRESS NOTE    Margaret Jones  AOZ:[id number] DOB: 1996-03-27 DOA: 09/18/2015  PCP: No PCP Per Patient   Brief Narrative:  20 y/o with abdominal pain starting during pregnancy s/p lap chole 3 mo ago after childbirth. She presents with a recurrence of upper abdominal pain with vomiting.   Subjective: Hungry but afraid to eat. Currently no pain or nausea.   Assessment & Plan:   Active Problems:   Abnormal LFTs/ Abdominal pain - s.p lap chole - MRCP performed- normal - start clears and monitor for recurrence of symptoms- she states she has tried treatment with Omeprazole daily and pain has no improved- also states she was given flexeril for possible muscular pain but this made her sleepy - order hepatitis panel - - GI is following- continue Levaquin??  Fatty liver - discussed need to lose weight but question if this may have another cause other than obesity - check lipids  Anemia - check anemia panel in AM - check stool occults   DVT prophylaxis: Lovenox Code Status: full code Family Communication: husband Disposition Plan: f/u for further pain- follow LFTs Consultants:   GI Procedures:    Antimicrobials:  Anti-infectives    Start     Dose/Rate Route Frequency Ordered Stop   09/18/15 2359  levofloxacin (LEVAQUIN) IVPB 750 mg     750 mg 100 mL/hr over 90 Minutes Intravenous Every 24 hours 09/18/15 2331     09/18/15 2230  cefTRIAXone (ROCEPHIN) 1 g in dextrose 5 % 50 mL IVPB     1 g 100 mL/hr over 30 Minutes Intravenous  Once 09/18/15 2219 09/18/15 2301       Objective: Filed Vitals:   09/18/15 1846 09/19/15 0020 09/19/15 0612 09/19/15 1340  BP:  123/61 123/56 115/60  Pulse:  88 62 58  Temp:  98.2 F (36.8 C) 97.8 F (36.6 C) 98.4 F (36.9 C)  TempSrc:  Oral Oral Oral  Resp:  16 15 16   Height: 5\' 2"  (1.575 m)     Weight: 83.008 kg (183 lb)     SpO2:  98% 99% 95%    Intake/Output Summary (Last 24 hours) at 09/19/15 1527 Last data filed at 09/19/15  1300  Gross per 24 hour  Intake      0 ml  Output      0 ml  Net      0 ml   Filed Weights   09/18/15 1846  Weight: 83.008 kg (183 lb)    Examination: General exam: Appears comfortable  HEENT: PERRLA, oral mucosa moist, no sclera icterus or thrush Respiratory system: Clear to auscultation. Respiratory effort normal. Cardiovascular system: S1 & S2 heard, RRR.  No murmurs  Gastrointestinal system: Abdomen soft,  -tender in epigastrium and RUQ, nondistended. Normal bowel sound. No organomegaly Central nervous system: Alert and oriented. No focal neurological deficits. Extremities: No cyanosis, clubbing or edema Skin: No rashes or ulcers Psychiatry:  Mood & affect appropriate.     Data Reviewed: I have personally reviewed following labs and imaging studies  CBC:  Recent Labs Lab 09/18/15 0509 09/18/15 2025 09/19/15 0548  WBC 7.8 5.7 4.8  NEUTROABS 5.7 4.4  --   HGB 11.0* 11.0* 10.9*  HCT 34.3* 33.2* 33.6*  MCV 80.7 78.3 78.0  PLT 381 376 340   Basic Metabolic Panel:  Recent Labs Lab 09/18/15 0509 09/18/15 2025 09/19/15 0548  NA 137 136 135  K 3.7 3.7 4.0  CL 106 108 107  CO2 23 21* 21*  GLUCOSE 112* 88 179*  BUN 14 8 6   CREATININE 0.66 0.58 0.59  CALCIUM 8.9 8.8* 8.8*   GFR: Estimated Creatinine Clearance: 110.2 mL/min (by C-G formula based on Cr of 0.59). Liver Function Tests:  Recent Labs Lab 09/18/15 0509 09/18/15 2025 09/19/15 0548  AST 407* 771* 630*  ALT 206* 742* 787*  ALKPHOS 87 114 126  BILITOT 0.8 3.5* 4.1*  PROT 8.2* 7.9 7.8  ALBUMIN 4.6 4.4 4.3    Recent Labs Lab 09/18/15 0509 09/18/15 2025  LIPASE 38 24   No results for input(s): AMMONIA in the last 168 hours. Coagulation Profile:  Recent Labs Lab 09/19/15 0548  INR 1.13   Cardiac Enzymes: No results for input(s): CKTOTAL, CKMB, CKMBINDEX, TROPONINI in the last 168 hours. BNP (last 3 results) No results for input(s): PROBNP in the last 8760 hours. HbA1C: No results  for input(s): HGBA1C in the last 72 hours. CBG:  Recent Labs Lab 09/19/15 0731  GLUCAP 149*   Lipid Profile: No results for input(s): CHOL, HDL, LDLCALC, TRIG, CHOLHDL, LDLDIRECT in the last 72 hours. Thyroid Function Tests: No results for input(s): TSH, T4TOTAL, FREET4, T3FREE, THYROIDAB in the last 72 hours. Anemia Panel: No results for input(s): VITAMINB12, FOLATE, FERRITIN, TIBC, IRON, RETICCTPCT in the last 72 hours. Urine analysis:    Component Value Date/Time   COLORURINE AMBER* 09/18/2015 2157   APPEARANCEUR CLOUDY* 09/18/2015 2157   LABSPEC 1.019 09/18/2015 2157   PHURINE 7.5 09/18/2015 2157   GLUCOSEU NEGATIVE 09/18/2015 2157   HGBUR NEGATIVE 09/18/2015 2157   BILIRUBINUR SMALL* 09/18/2015 2157   KETONESUR NEGATIVE 09/18/2015 2157   PROTEINUR NEGATIVE 09/18/2015 2157   NITRITE NEGATIVE 09/18/2015 2157   LEUKOCYTESUR SMALL* 09/18/2015 2157   Sepsis Labs: @LABRCNTIP (procalcitonin:4,lacticidven:4) )No results found for this or any previous visit (from the past 240 hour(s)).       Radiology Studies: Ct Abdomen Pelvis W Contrast  09/18/2015  CLINICAL DATA:  Awoke at 3 p.m. with epigastric pain, pain radiating to back rated at 10/10, upper abdominal pain, nausea, vomiting, prior cholecystectomy 2 months ago, initial encounter EXAM: CT ABDOMEN AND PELVIS WITH CONTRAST TECHNIQUE: Multidetector CT imaging of the abdomen and pelvis was performed using the standard protocol following bolus administration of intravenous contrast. Sagittal and coronal MPR images reconstructed from axial data set. CONTRAST:  ISOVUE-300 IOPAMIDOL (ISOVUE-300) INJECTION 61% IV. Dilute oral contrast. COMPARISON:  None; correlation ultrasound abdomen 09/18/2015 FINDINGS: Lower chest:  Lung bases clear Hepatobiliary: Diffuse fatty infiltration of liver. Post cholecystectomy. Mildly prominent CBD and central intrahepatic biliary radicles, CBD 7 mm proximally and 8 mm distal, question due to  cholecystectomy. No definite CBD stone by CT. No abnormal fluid collection at gallbladder fossa. Pancreas: Normal appearance Spleen: Normal appearance Adrenals/Urinary Tract: Adrenal glands normal appearance. Kidneys normal appearance. Ureters normal appearance. Bladder wall appears thickened though bladder is underdistended at least in part artifactual. Stomach/Bowel: Normal appendix. Stomach and bowel loops normal appearance. Vascular/Lymphatic: Vascular structures unremarkable. No adenopathy. Reproductive: Uterus and LEFT adnexa normal appearance. Tiny cyst RIGHT ovary. Small amount of nonspecific low-attenuation free pelvic fluid. Other: No mass, free air, or hernia. Musculoskeletal: Bones unremarkable. IMPRESSION: Diffuse fatty infiltration of liver. Mild extrahepatic and central intrahepatic biliary dilatation post cholecystectomy, recommend correlation with LFTs. Thickened bladder wall, likely at least partly due to underdistention though cannot exclude other causes of bladder wall thickening including infection and chronic outlet obstruction, tumor unlikely ; recommend correlation with urinalysis. Small amount of free pelvic fluid of uncertain etiology.  Electronically Signed   By: Ulyses Southward M.D.   On: 09/18/2015 21:39   Mr 3d Recon At Scanner  09/19/2015  CLINICAL DATA:  Cholecystectomy 3 months ago with abdominal pain. Nausea and vomiting. Elevated liver function tests. EXAM: MRI ABDOMEN WITHOUT AND WITH CONTRAST (INCLUDING MRCP) TECHNIQUE: Multiplanar multisequence MR imaging of the abdomen was performed both before and after the administration of intravenous contrast. Heavily T2-weighted images of the biliary and pancreatic ducts were obtained, and three-dimensional MRCP images were rendered by post processing. CONTRAST:  20mL MULTIHANCE GADOBENATE DIMEGLUMINE 529 MG/ML IV SOLN COMPARISON:  CT and ultrasound of 1 day prior. FINDINGS: Lower chest: Normal heart size without pericardial or pleural  effusion. Hepatobiliary: Moderate hepatomegaly at 19.2 cm craniocaudal. Marked hepatic steatosis. No focal liver lesion. Cholecystectomy. Mild intrahepatic ductal dilatation after cholecystectomy. Example 7 mm left hepatic duct on image 23/series 4. The common duct is unremarkable after cholecystectomy. Example at 8 mm in the porta hepatis on image 71/ series 5. Tapers minimally within the pancreatic head. No choledocholithiasis. Pancreas: Normal, without mass or ductal dilatation. Spleen: Normal in size, without focal abnormality. Adrenals/Urinary Tract: Normal adrenal glands. Normal kidneys, without hydronephrosis. Stomach/Bowel: Normal stomach and abdominal bowel loops. Vascular/Lymphatic: Normal caliber of the aorta and branch vessels. No retroperitoneal or retrocrural adenopathy. Other: No ascites. Musculoskeletal: No acute osseous abnormality. IMPRESSION: 1. Status post cholecystectomy. Mild intrahepatic ductal dilatation, without cause identified. No evidence of common duct dilatation or stone. 2. Hepatomegaly and marked hepatic steatosis. Electronically Signed   By: Jeronimo Greaves M.D.   On: 09/19/2015 12:32   Mr Roe Coombs W/wo Cm/mrcp  09/19/2015  CLINICAL DATA:  Cholecystectomy 3 months ago with abdominal pain. Nausea and vomiting. Elevated liver function tests. EXAM: MRI ABDOMEN WITHOUT AND WITH CONTRAST (INCLUDING MRCP) TECHNIQUE: Multiplanar multisequence MR imaging of the abdomen was performed both before and after the administration of intravenous contrast. Heavily T2-weighted images of the biliary and pancreatic ducts were obtained, and three-dimensional MRCP images were rendered by post processing. CONTRAST:  20mL MULTIHANCE GADOBENATE DIMEGLUMINE 529 MG/ML IV SOLN COMPARISON:  CT and ultrasound of 1 day prior. FINDINGS: Lower chest: Normal heart size without pericardial or pleural effusion. Hepatobiliary: Moderate hepatomegaly at 19.2 cm craniocaudal. Marked hepatic steatosis. No focal liver lesion.  Cholecystectomy. Mild intrahepatic ductal dilatation after cholecystectomy. Example 7 mm left hepatic duct on image 23/series 4. The common duct is unremarkable after cholecystectomy. Example at 8 mm in the porta hepatis on image 71/ series 5. Tapers minimally within the pancreatic head. No choledocholithiasis. Pancreas: Normal, without mass or ductal dilatation. Spleen: Normal in size, without focal abnormality. Adrenals/Urinary Tract: Normal adrenal glands. Normal kidneys, without hydronephrosis. Stomach/Bowel: Normal stomach and abdominal bowel loops. Vascular/Lymphatic: Normal caliber of the aorta and branch vessels. No retroperitoneal or retrocrural adenopathy. Other: No ascites. Musculoskeletal: No acute osseous abnormality. IMPRESSION: 1. Status post cholecystectomy. Mild intrahepatic ductal dilatation, without cause identified. No evidence of common duct dilatation or stone. 2. Hepatomegaly and marked hepatic steatosis. Electronically Signed   By: Jeronimo Greaves M.D.   On: 09/19/2015 12:32   US Abdomen Limited Ruq  09/18/2015  CLINICAL DATA:  Chest pain. EXAM: US ABDOMEN LIMITED - RIGHT UPPER QUADRANT COMPARISON:  No prior. FINDINGS: Gallbladder: Cholecystectomy. Common bile duct: Diameter: 6.8 mm. Liver: Liver is echogenic consistent fatty infiltration and/or hepatocellular disease. IMPRESSION: 1. Cholecystectomy.  No biliary distention. 2. Echogenic liver consistent with fatty infiltration and/or hepatocellular disease. Electronically Signed   By: Maisie Fus  Register   On:  09/18/2015 07:38      Scheduled Meds: . sodium chloride   Intravenous Once  . enoxaparin (LOVENOX) injection  40 mg Subcutaneous QHS  . famotidine (PEPCID) IV  20 mg Intravenous Q12H  . levofloxacin (LEVAQUIN) IV  750 mg Intravenous Q24H   Continuous Infusions: . dextrose 5 % and 0.45% NaCl 100 mL/hr at 09/18/15 2323        Time spent in minutes: 35    Hershel Corkery, MD [facility] Pager: [email or web address] Password [facility] 09/19/2015, 3:27 PM

## 2015-09-19 NOTE — Consult Note (Signed)
Subjective:   HPI  The patient is a 20 year old female who was admitted to the hospital after 2 presentations to the emergency department in the last day. She came because of upper abdominal pain which was located in the epigastrium and right upper quadrant. There was some radiation of pain to her back. She had a cholecystectomy done 2 months ago secondary to gallstones. [place]. In the emergency department she was found to have elevated liver enzymes. Today her total bilirubin is 4.1, AST 630 ALT 787. A CT of the abdomen was done which showed fatty liver, mild extra and central intrahepatic ductal dilatation.  Review of Systems No chest pain or shortness of breath  Past Medical History  Diagnosis Date  . Gall stones    Past Surgical History  Procedure Laterality Date  . Cholecystectomy    . Cesarean section     Social History   Social History  . Marital Status: Single    Spouse Name: N/A  . Number of Children: N/A  . Years of Education: N/A   Occupational History  . Not on file.   Social History Main Topics  . Smoking status: Never Smoker   . Smokeless tobacco: Not on file  . Alcohol Use: No  . Drug Use: No  . Sexual Activity: Not on file   Other Topics Concern  . Not on file   Social History Narrative   family history is not on file.  Current facility-administered medications:  .  0.9 %  sodium chloride infusion, , Intravenous, Once, Pricilla LovelessScott Goldston, MD, Stopped at 09/18/15 2230 .  dextrose 5 %-0.45 % sodium chloride infusion, , Intravenous, Continuous, Eston EstersAhmad Hamad, MD, Last Rate: 100 mL/hr at 09/18/15 2323 .  enoxaparin (LOVENOX) injection 40 mg, 40 mg, Subcutaneous, QHS, Eston EstersAhmad Hamad, MD, 40 mg at 09/18/15 2324 .  famotidine (PEPCID) IVPB 20 mg premix, 20 mg, Intravenous, Q12H, Eston EstersAhmad Hamad, MD, Stopped at 09/18/15 2353 .  levofloxacin (LEVAQUIN) IVPB 750 mg, 750 mg, Intravenous, Q24H, Leann T Poindexter, RPH, 750 mg at 09/19/15 0147 .  morphine 2  MG/ML injection 2 mg, 2 mg, Intravenous, Q3H PRN, Eston EstersAhmad Hamad, MD .  ondansetron [facility](ZOFRAN) injection 4 mg, 4 mg, Intravenous, Q6H PRN, Eston EstersAhmad Hamad, MD Allergies  Allergen Reactions  . Rocephin [Ceftriaxone Sodium In Dextrose] Anaphylaxis    Given Epi 0.3mg      Objective:     BP 123/56 mmHg  Pulse 62  Temp(Src) 97.8 F (36.6 C) (Oral)  Resp 15  Ht 5\' 2"  (1.575 m)  Wt 83.008 kg (183 lb)  BMI 33.46 kg/m2  SpO2 99%  LMP 08/30/2015  She is in no distress  Heart regular rhythm no murmurs  Lungs clear  Abdomen: Bowel sounds present, soft, there is some tenderness in the epigastrium and right upper quadrant  Laboratory No components found for: D1    Assessment:     Abdominal pain  Elevated liver enzymes  Dilated biliary ducts  Rule out choledocholithiasis      Plan:     Proceed with MRCP to further investigate. Further decisions will be made after reviewing that. Agree with antibiotics.

## 2015-09-20 LAB — COMPREHENSIVE METABOLIC PANEL
ALBUMIN: 3.9 g/dL (ref 3.5–5.0)
ALK PHOS: 116 U/L (ref 38–126)
ALT: 801 U/L — AB (ref 14–54)
AST: 495 U/L — AB (ref 15–41)
Anion gap: 9 (ref 5–15)
BILIRUBIN TOTAL: 4.8 mg/dL — AB (ref 0.3–1.2)
BUN: 8 mg/dL (ref 6–20)
CALCIUM: 8.4 mg/dL — AB (ref 8.9–10.3)
CO2: 22 mmol/L (ref 22–32)
Chloride: 105 mmol/L (ref 101–111)
Creatinine, Ser: 0.65 mg/dL (ref 0.44–1.00)
GFR calc Af Amer: >60 mL/min (ref >60)
GFR calc non Af Amer: >60 mL/min (ref >60)
GLUCOSE: 120 mg/dL — AB (ref 65–99)
POTASSIUM: 3.2 mmol/L — AB (ref 3.5–5.1)
Sodium: 136 mmol/L (ref 135–145)
TOTAL PROTEIN: 7 g/dL (ref 6.5–8.1)

## 2015-09-20 LAB — GLUCOSE, CAPILLARY: GLUCOSE-CAPILLARY: 120 mg/dL — AB (ref 65–99)

## 2015-09-20 LAB — HEPATITIS PANEL, ACUTE
Hep A IgM: NEGATIVE
Hep B C IgM: NEGATIVE
Hepatitis B Surface Ag: NEGATIVE

## 2015-09-20 LAB — IRON AND TIBC
Iron: 46 ug/dL (ref 28–170)
Saturation Ratios: 11 % (ref 10.4–31.8)
TIBC: 402 ug/dL (ref 250–450)
UIBC: 356 ug/dL

## 2015-09-20 LAB — VITAMIN B12: Vitamin B-12: 835 pg/mL (ref 180–914)

## 2015-09-20 LAB — FOLATE: Folate: 35.9 ng/mL (ref >5.9)

## 2015-09-20 LAB — LIPID PANEL
CHOLESTEROL: 127 mg/dL (ref 0–200)
HDL: 48 mg/dL (ref >40)
LDL Cholesterol: 62 mg/dL (ref 0–99)
TRIGLYCERIDES: 84 mg/dL (ref <150)
Total CHOL/HDL Ratio: 2.6 RATIO
VLDL: 17 mg/dL (ref 0–40)

## 2015-09-20 LAB — CBC
HEMATOCRIT: 31.8 % — AB (ref 36.0–46.0)
Hemoglobin: 10.2 g/dL — ABNORMAL LOW (ref 12.0–15.0)
MCH: 25.9 pg — AB (ref 26.0–34.0)
MCHC: 32.1 g/dL (ref 30.0–36.0)
MCV: 80.7 fL (ref 78.0–100.0)
Platelets: 357 10*3/uL (ref 150–400)
RBC: 3.94 MIL/uL (ref 3.87–5.11)
RDW: 14.5 % (ref 11.5–15.5)
WBC: 5.9 10*3/uL (ref 4.0–10.5)

## 2015-09-20 LAB — FERRITIN: FERRITIN: 67 ng/mL (ref 11–307)

## 2015-09-20 LAB — RETICULOCYTES
RBC.: 3.94 MIL/uL (ref 3.87–5.11)
RETIC COUNT ABSOLUTE: 90.6 10*3/uL (ref 19.0–186.0)
RETIC CT PCT: 2.3 % (ref 0.4–3.1)

## 2015-09-20 MED ORDER — BISACODYL 10 MG RE SUPP
10.0000 mg | Freq: Once | RECTAL | Status: AC
  Administered 2015-09-20: 10 mg via RECTAL
  Filled 2015-09-20: qty 1

## 2015-09-20 MED ORDER — SENNOSIDES-DOCUSATE SODIUM 8.6-50 MG PO TABS
ORAL_TABLET | ORAL | Status: AC
  Administered 2015-09-20: 05:00:00
  Filled 2015-09-20: qty 1

## 2015-09-20 MED ORDER — POLYETHYLENE GLYCOL 3350 17 G PO PACK
17.0000 g | PACK | Freq: Every day | ORAL | Status: DC
  Administered 2015-09-20: 17 g via ORAL
  Filled 2015-09-20 (×2): qty 1

## 2015-09-20 MED ORDER — SODIUM CHLORIDE 0.9 % IV SOLN: INTRAVENOUS | Status: DC

## 2015-09-20 MED ORDER — ENOXAPARIN SODIUM 40 MG/0.4ML ~~LOC~~ SOLN
40.0000 mg | Freq: Every day | SUBCUTANEOUS | Status: DC
  Administered 2015-09-21: 40 mg via SUBCUTANEOUS
  Filled 2015-09-20 (×3): qty 0.4

## 2015-09-20 MED ORDER — MORPHINE SULFATE (PF) 2 MG/ML IV SOLN
2.0000 mg | INTRAVENOUS | Status: DC | PRN
  Administered 2015-09-20 (×4): 2 mg via INTRAVENOUS
  Filled 2015-09-20 (×4): qty 1

## 2015-09-20 MED ORDER — SENNOSIDES-DOCUSATE SODIUM 8.6-50 MG PO TABS
2.0000 | ORAL_TABLET | Freq: Once | ORAL | Status: AC
  Administered 2015-09-20: 2 via ORAL
  Filled 2015-09-20: qty 2

## 2015-09-20 MED ORDER — SENNOSIDES-DOCUSATE SODIUM 8.6-50 MG PO TABS
2.0000 | ORAL_TABLET | Freq: Two times a day (BID) | ORAL | Status: DC
  Administered 2015-09-20 – 2015-09-22 (×2): 2 via ORAL
  Filled 2015-09-20 (×7): qty 2

## 2015-09-20 MED ORDER — POTASSIUM CHLORIDE CRYS ER 20 MEQ PO TBCR
40.0000 meq | EXTENDED_RELEASE_TABLET | Freq: Once | ORAL | Status: AC
  Administered 2015-09-20: 40 meq via ORAL
  Filled 2015-09-20: qty 2

## 2015-09-20 MED ORDER — OXYCODONE HCL 5 MG PO TABS
10.0000 mg | ORAL_TABLET | Freq: Once | ORAL | Status: DC
  Filled 2015-09-20: qty 2

## 2015-09-20 NOTE — Progress Notes (Signed)
Paged MD on call x 4 for new pain medication. Patient states that fentanyl 25 mcg is not relieving pain even after restarting IV which was thought to be leaking therefore causing patient to not receive full dose of pain medication.

## 2015-09-20 NOTE — Progress Notes (Signed)
[facility] Progress Note  Subjective: The patient continues to experience upper abdominal pain. Her liver enzymes remain elevated. Her MRCP did not reveal obvious choledocholithiasis. We talked about this. Patient states this pain she is having is exactly the same type of pain she had when she had gallstones and when she had her gallbladder.  Objective: Vital signs in last 24 hours: Temp:  [98.1 F (36.7 C)-99.2 F (37.3 C)] 98.8 F (37.1 C) (06/21 1437) Pulse Rate:  [55-75] 55 (06/21 1437) Resp:  [15-16] 16 (06/21 1437) BP: (109-114)/(52-72) 109/52 mmHg (06/21 1437) SpO2:  [99 %-100 %] 100 % (06/21 1437) Weight change:    PE:  No distress  Abdomen there is still some tenderness in the upper abdomen  Lab Results: Results for orders placed or performed during the hospital encounter of 09/18/15 (from the past 24 hour(s))  Vitamin B12     Status: None   Collection Time: 09/20/15  5:31 AM  Result Value Ref Range   Vitamin B-12 835 180 - 914 pg/mL  Folate     Status: None   Collection Time: 09/20/15  5:31 AM  Result Value Ref Range   Folate 35.9 >5.9 ng/mL  Iron and TIBC     Status: None   Collection Time: 09/20/15  5:31 AM  Result Value Ref Range   Iron 46 28 - 170 ug/dL   TIBC 161402 096250 - 045450 ug/dL   Saturation Ratios 11 10.4 - 31.8 %   UIBC 356 ug/dL  Ferritin     Status: None   Collection Time: 09/20/15  5:31 AM  Result Value Ref Range   Ferritin 67 11 - 307 ng/mL  Reticulocytes     Status: None   Collection Time: 09/20/15  5:31 AM  Result Value Ref Range   Retic Ct Pct 2.3 0.4 - 3.1 %   RBC. 3.94 3.87 - 5.11 MIL/uL   Retic Count, Manual 90.6 19.0 - 186.0 K/uL  Lipid panel     Status: None   Collection Time: 09/20/15  5:31 AM  Result Value Ref Range   Cholesterol 127 0 - 200 mg/dL   Triglycerides 84 <409<150 mg/dL   HDL 48 >81>40 mg/dL   Total CHOL/HDL Ratio 2.6 RATIO   VLDL 17 0 - 40 mg/dL   LDL Cholesterol 62 0 - 99 mg/dL  Comprehensive metabolic panel      Status: Abnormal   Collection Time: 09/20/15  5:31 AM  Result Value Ref Range   Sodium 136 135 - 145 mmol/L   Potassium 3.2 (L) 3.5 - 5.1 mmol/L   Chloride 105 101 - 111 mmol/L   CO2 22 22 - 32 mmol/L   Glucose, Bld 120 (H) 65 - 99 mg/dL   BUN 8 6 - 20 mg/dL   Creatinine, Ser 1.910.65 0.44 - 1.00 mg/dL   Calcium 8.4 (L) 8.9 - 10.3 mg/dL   Total Protein 7.0 6.5 - 8.1 g/dL   Albumin 3.9 3.5 - 5.0 g/dL   AST 478495 (H) 15 - 41 U/L   ALT 801 (H) 14 - 54 U/L   Alkaline Phosphatase 116 38 - 126 U/L   Total Bilirubin 4.8 (H) 0.3 - 1.2 mg/dL   GFR calc non Af Amer >60 >60 mL/min   GFR calc Af Amer >60 >60 mL/min   Anion gap 9 5 - 15  CBC     Status: Abnormal   Collection Time: 09/20/15  5:31 AM  Result Value Ref Range   WBC  5.9 4.0 - 10.5 K/uL   RBC 3.94 3.87 - 5.11 MIL/uL   Hemoglobin 10.2 (L) 12.0 - 15.0 g/dL   HCT 16.1 (L) 09.6 - 04.5 %   MCV 80.7 78.0 - 100.0 fL   MCH 25.9 (L) 26.0 - 34.0 pg   MCHC 32.1 30.0 - 36.0 g/dL   RDW 40.9 81.1 - 91.4 %   Platelets 357 150 - 400 K/uL  Glucose, capillary     Status: Abnormal   Collection Time: 09/20/15  7:34 AM  Result Value Ref Range   Glucose-Capillary 120 (H) 65 - 99 mg/dL    Studies/Results: No results found.    Assessment: Given the ongoing upper abdominal pain, associated with elevated liver enzymes, and some dilation of the bile ducts on imaging, this is very suspect for choledocholithiasis even though stones were not seen in the biliary tree on MRCP. She could have small stones or gravel too small to show up on the MRCP. We talked about this and talked about the next step being ERCP with sphincterotomy.  Plan:   ERCP with sphincterotomy and probable stone extraction if the stone is found. This will at least help drain the biliary tree. Also we can look for any other abnormalities in the biliary tree which might explain the slight biliary ductal dilatation. I explained ERCP with sphincterotomy in detail to her along with the  potential risks of bleeding, infection, perforation, and pancreatitis. She understands. She asked appropriate questions. She consents to the procedure. I have discussed this with my partner Dr. Ewing Schlein and he will be doing the test tomorrow.    Margaret Jones 09/20/2015, 5:09 PM  Pager: [phone] If no answer or after 5 PM call [phone] Lab Results  Component Value Date   HGB 10.2* 09/20/2015   HGB 10.9* 09/19/2015   HGB 11.0* 09/18/2015   HCT 31.8* 09/20/2015   HCT 33.6* 09/19/2015   HCT 33.2* 09/18/2015   ALKPHOS 116 09/20/2015   ALKPHOS 126 09/19/2015   ALKPHOS 114 09/18/2015   AST 495* 09/20/2015   AST 630* 09/19/2015   AST 771* 09/18/2015   ALT 801* 09/20/2015   ALT 787* 09/19/2015   ALT 742* 09/18/2015

## 2015-09-20 NOTE — Clinical Social Work Note (Addendum)
Clinical Social Work Assessment  Patient Details  Name: Margaret Jones MRN: [id number] Date of Birth: Jun 13, 1995  Date of referral:  09/20/15               Reason for consult:  Housing Concerns/Homelessness, [employer], Ecologist sought to share information with:  Chief Strategy Officer granted to share information::  Yes, Verbal Permission Granted  Name::     Marg[facility]::     Relationship::     Contact Information:     Housing/Transportation Living arrangements for the past 2 months:  Homeless, Hotel/Motel, [facility] of Information:  Patient, Medical Team Patient Interpreter Needed:  None Criminal Activity/Legal Involvement Pertinent to Current Situation/Hospitalization:  No - Comment as needed Significant Relationships:  Warehouse manager Lives with:    Do you feel safe going back to the place where you live?  Yes Need for family participation in patient care:  No (Coment)  Care giving concerns: Patient was admitted to the [facility] with upper abdominal pain. Patient is currently homeless with 80 old baby and finance currently residing in the room with her.    Social Worker assessment / plan:  LCSWA met with patient at bedside, finance and five month old baby too. LCSWA inquired about patient and family wellbeing at this Margaret. Patient states they have been in Margaret Jones for five days, they where living in Margaret [facility] with a cousin for a week. Patient states they have been staying in a hotel room provided by the Margaret Jones where they have been receiving resources and services. Patient states they are currently on the waiting list for [facility] program with Margaret Jones. Patient does not have a definitive date for admission. Patient states she had a job interview with a temp. Program that was scheduled for Monday but unable to attend due to pain and going to ED. Patient  states they currently do not have transportation. Patient states she receives Margaret Jones. For the baby. LCSWA provided patient fiance with Meal Voucher. Patient provided LCSWA with permission to contact Margaret Jones.   LCSWA will continue to support and follow patient provide needed resources.   Employment status:  Unemployed Forensic scientist:  Medicaid [place] PT Recommendations:  Not assessed at this Margaret Information / Referral to community resources:  Shelter  Patient/Family's Response to care: Agreeable  Patient/Family's Understanding of and Emotional Response to Diagnosis, Current Treatment, and Prognosis:  Patient states she does not understand her diagnosis at this Margaret she was  admitted with worsening abdominal pain to 3 days prior to admission. Patient reports feeling frustrated because of her lack of understanding of her health.  Emotional Assessment Appearance:  Appears stated age Attitude/Demeanor/Rapport:  Other (Cooperative) Affect (typically observed):    Orientation:  Oriented to Self, Oriented to Place, Oriented to  Margaret, Oriented to Situation Alcohol / Substance use:  Not Applicable Psych involvement (Current and /or in the community):  No (Comment)  Discharge Needs  Concerns to be addressed:    Readmission within the last 30 days:  Yes Current discharge risk:  Homeless Barriers to Discharge:  No Barriers Identified (Homeless)   Lia Hopping, LCSW 09/20/2015, 3:35 PM

## 2015-09-20 NOTE — Progress Notes (Addendum)
PROGRESS NOTE    Tehilla Coffel  VHQ:[id number] DOB: 1995/11/21 DOA: 09/18/2015  PCP: No PCP Per Patient   Brief Narrative:  20/F with prior history of right-sided abdominal pain in [place], underwent laparoscopic cholecystectomy in April in [place], she is 5 months postpartum with and currently homeless, admitted with worsening abdominal pain to 3 days prior to admission . Found to have elevated LFTs, right upper quadrant ultrasound was unremarkable , she underwent an MRCP yesterday this was negative, equal GI following   Subjective -complains of pain medicines , requesting morphine instead of fentanyl IV , reports dry heaving and emesis (none witnessed by staff)   Assessment & Plan:   Active Problems:   Abdominal pain/Elevated LFTs  she has abnormal LFTs and elevated bilirubin(Found to have elevated LFTs this was noted back in April from care everywhere as well )-which could be NASH -Status post laparoscopic cholecystectomy in April [facility] GI consulting -Right upper quadrant ultrasound and MRCP unremarkable -Continue clear/supportive care/advance diet as tolerated -Continue PPI , await Gi input today -Hepatitis panel negative, will also check for autoimmune hepatitis -I have stopped Levaquin, caution with medications since she is nursing  Fatty liver - discussed need to lose weight but question if this may have another cause other than obesity  Anemia - FU anemia, this might be post partum  Constipation -due to narcotics, add senokot and miralax  DVT prophylaxis: Lovenox  Code Status: full code Family Communication: husband and baby at bedside Disposition Plan: They are homeless, CSW following, DC pending workup per GI Consultants: Enid Baas   Procedures: MRCP   Antimicrobials:  Anti-infectives    Start     Dose/Rate Route Frequency Ordered Stop   09/18/15 2359  levofloxacin (LEVAQUIN) IVPB 750 mg  Status:  Discontinued     750 mg 100 mL/hr over 90 Minutes  Intravenous Every 24 hours 09/18/15 2331 09/20/15 0743   09/18/15 2230  cefTRIAXone (ROCEPHIN) 1 g in dextrose 5 % 50 mL IVPB     1 g 100 mL/hr over 30 Minutes Intravenous  Once 09/18/15 2219 09/18/15 2301       Objective: Filed Vitals:   09/19/15 1340 09/19/15 2240 09/20/15 0627 09/20/15 0743  BP: 115/60 112/70 113/67 114/72  Pulse: 58 69 60 75  Temp: 98.4 F (36.9 C) 99.1 F (37.3 C) 99.2 F (37.3 C) 98.1 F (36.7 C)  TempSrc: Oral Oral Oral Oral  Resp: Height:      Weight:      SpO2: 95% 99% 100% 100%    Intake/Output Summary (Last 24 hours) at 09/20/15 1114 Last data filed at 09/19/15 1700  Gross per 24 hour  Intake      0 ml  Output      0 ml  Net      0 ml   Filed Weights   09/18/15 1846  Weight: 83.008 kg (183 lb)    Examination: General exam: tearful, moaning in pain now, was fine earlier when i walked by the room HEENT: PERRLA, oral mucosa moist, no sclera icterus or thrush Respiratory system: Clear to auscultation. Respiratory effort normal. Cardiovascular system: S1 & S2 heard, RRR.  No murmurs  Gastrointestinal system: Abdomen soft,  -tender in epigastrium and RUQ, nondistended. Normal bowel sound. No organomegaly Central nervous system: Alert and oriented. No focal neurological deficits. Extremities: No cyanosis, clubbing or edema Skin: No rashes or ulcers Psychiatry:  Mood & affect appropriate.     Data Reviewed: I  have personally reviewed following labs and imaging studies  CBC:  Recent Labs Lab 09/18/15 0509 09/18/15 2025 09/19/15 0548 09/20/15 0531  WBC 7.8 5.7 4.8 5.9  NEUTROABS 5.7 4.4  --   --   HGB 11.0* 11.0* 10.9* 10.2*  HCT 34.3* 33.2* 33.6* 31.8*  MCV 80.7 78.3 78.0 80.7  PLT 381 376 340 357   Basic Metabolic Panel:  Recent Labs Lab 09/18/15 0509 09/18/15 2025 09/19/15 0548 09/20/15 0531  NA 137 136 135 136  K 3.7 3.7 4.0 3.2*  CL 106 108 107 105  CO2 23 21* 21* 22  GLUCOSE 112* 88 179* 120*  BUN 14  8 6 8   CREATININE 0.66 0.58 0.59 0.65  CALCIUM 8.9 8.8* 8.8* 8.4*   GFR: Estimated Creatinine Clearance: 110.2 mL/min (by C-G formula based on Cr of 0.65). Liver Function Tests:  Recent Labs Lab 09/18/15 0509 09/18/15 2025 09/19/15 0548 09/20/15 0531  AST 407* 771* 630* 495*  ALT 206* 742* 787* 801*  ALKPHOS 87 114 126 116  BILITOT 0.8 3.5* 4.1* 4.8*  PROT 8.2* 7.9 7.8 7.0  ALBUMIN 4.6 4.4 4.3 3.9    Recent Labs Lab 09/18/15 0509 09/18/15 2025  LIPASE 38 24   No results for input(s): AMMONIA in the last 168 hours. Coagulation Profile:  Recent Labs Lab 09/19/15 0548  INR 1.13   Cardiac Enzymes: No results for input(s): CKTOTAL, CKMB, CKMBINDEX, TROPONINI in the last 168 hours. BNP (last 3 results) No results for input(s): PROBNP in the last 8760 hours. HbA1C: No results for input(s): HGBA1C in the last 72 hours. CBG:  Recent Labs Lab 09/19/15 0731 09/20/15 0734  GLUCAP 149* 120*   Lipid Profile:  Recent Labs  09/20/15 0531  CHOL 127  HDL 48  LDLCALC 62  TRIG 84  CHOLHDL 2.6   Thyroid Function Tests: No results for input(s): TSH, T4TOTAL, FREET4, T3FREE, THYROIDAB in the last 72 hours. Anemia Panel:  Recent Labs  09/20/15 0531  RETICCTPCT 2.3   Urine analysis:    Component Value Date/Time   COLORURINE AMBER* 09/18/2015 2157   APPEARANCEUR CLOUDY* 09/18/2015 2157   LABSPEC 1.019 09/18/2015 2157   PHURINE 7.5 09/18/2015 2157   GLUCOSEU NEGATIVE 09/18/2015 2157   HGBUR NEGATIVE 09/18/2015 2157   BILIRUBINUR SMALL* 09/18/2015 2157   KETONESUR NEGATIVE 09/18/2015 2157   PROTEINUR NEGATIVE 09/18/2015 2157   NITRITE NEGATIVE 09/18/2015 2157   LEUKOCYTESUR SMALL* 09/18/2015 2157   Sepsis Labs: @LABRCNTIP (procalcitonin:4,lacticidven:4) )No results found for this or any previous visit (from the past 240 hour(s)).       Radiology Studies: Ct Abdomen Pelvis W Contrast  09/18/2015  CLINICAL DATA:  Awoke at 3 p.m. with epigastric pain,  pain radiating to back rated at 10/10, upper abdominal pain, nausea, vomiting, prior cholecystectomy 2 months ago, initial encounter EXAM: CT ABDOMEN AND PELVIS WITH CONTRAST TECHNIQUE: Multidetector CT imaging of the abdomen and pelvis was performed using the standard protocol following bolus administration of intravenous contrast. Sagittal and coronal MPR images reconstructed from axial data set. CONTRAST:  ISOVUE-300 IOPAMIDOL (ISOVUE-300) INJECTION 61% IV. Dilute oral contrast. COMPARISON:  None; correlation ultrasound abdomen 09/18/2015 FINDINGS: Lower chest:  Lung bases clear Hepatobiliary: Diffuse fatty infiltration of liver. Post cholecystectomy. Mildly prominent CBD and central intrahepatic biliary radicles, CBD 7 mm proximally and 8 mm distal, question due to cholecystectomy. No definite CBD stone by CT. No abnormal fluid collection at gallbladder fossa. Pancreas: Normal appearance Spleen: Normal appearance Adrenals/Urinary Tract: Adrenal glands normal appearance.  Kidneys normal appearance. Ureters normal appearance. Bladder wall appears thickened though bladder is underdistended at least in part artifactual. Stomach/Bowel: Normal appendix. Stomach and bowel loops normal appearance. Vascular/Lymphatic: Vascular structures unremarkable. No adenopathy. Reproductive: Uterus and LEFT adnexa normal appearance. Tiny cyst RIGHT ovary. Small amount of nonspecific low-attenuation free pelvic fluid. Other: No mass, free air, or hernia. Musculoskeletal: Bones unremarkable. IMPRESSION: Diffuse fatty infiltration of liver. Mild extrahepatic and central intrahepatic biliary dilatation post cholecystectomy, recommend correlation with LFTs. Thickened bladder wall, likely at least partly due to underdistention though cannot exclude other causes of bladder wall thickening including infection and chronic outlet obstruction, tumor unlikely ; recommend correlation with urinalysis. Small amount of free pelvic fluid of  uncertain etiology. Electronically Signed   By: Ulyses SouthwardMark  Boles M.D.   On: 09/18/2015 21:39   Mr 3d Recon At Scanner  09/19/2015  CLINICAL DATA:  Cholecystectomy 3 months ago with abdominal pain. Nausea and vomiting. Elevated liver function tests. EXAM: MRI ABDOMEN WITHOUT AND WITH CONTRAST (INCLUDING MRCP) TECHNIQUE: Multiplanar multisequence MR imaging of the abdomen was performed both before and after the administration of intravenous contrast. Heavily T2-weighted images of the biliary and pancreatic ducts were obtained, and three-dimensional MRCP images were rendered by post processing. CONTRAST:  20mL MULTIHANCE GADOBENATE DIMEGLUMINE 529 MG/ML IV SOLN COMPARISON:  CT and ultrasound of 1 day prior. FINDINGS: Lower chest: Normal heart size without pericardial or pleural effusion. Hepatobiliary: Moderate hepatomegaly at 19.2 cm craniocaudal. Marked hepatic steatosis. No focal liver lesion. Cholecystectomy. Mild intrahepatic ductal dilatation after cholecystectomy. Example 7 mm left hepatic duct on image 23/series 4. The common duct is unremarkable after cholecystectomy. Example at 8 mm in the porta hepatis on image 71/ series 5. Tapers minimally within the pancreatic head. No choledocholithiasis. Pancreas: Normal, without mass or ductal dilatation. Spleen: Normal in size, without focal abnormality. Adrenals/Urinary Tract: Normal adrenal glands. Normal kidneys, without hydronephrosis. Stomach/Bowel: Normal stomach and abdominal bowel loops. Vascular/Lymphatic: Normal caliber of the aorta and branch vessels. No retroperitoneal or retrocrural adenopathy. Other: No ascites. Musculoskeletal: No acute osseous abnormality. IMPRESSION: 1. Status post cholecystectomy. Mild intrahepatic ductal dilatation, without cause identified. No evidence of common duct dilatation or stone. 2. Hepatomegaly and marked hepatic steatosis. Electronically Signed   By: Jeronimo GreavesKyle  Talbot M.D.   On: 09/19/2015 12:32   Mr Roe Coombsbd W/wo  Cm/mrcp  09/19/2015  CLINICAL DATA:  Cholecystectomy 3 months ago with abdominal pain. Nausea and vomiting. Elevated liver function tests. EXAM: MRI ABDOMEN WITHOUT AND WITH CONTRAST (INCLUDING MRCP) TECHNIQUE: Multiplanar multisequence MR imaging of the abdomen was performed both before and after the administration of intravenous contrast. Heavily T2-weighted images of the biliary and pancreatic ducts were obtained, and three-dimensional MRCP images were rendered by post processing. CONTRAST:  20mL MULTIHANCE GADOBENATE DIMEGLUMINE 529 MG/ML IV SOLN COMPARISON:  CT and ultrasound of 1 day prior. FINDINGS: Lower chest: Normal heart size without pericardial or pleural effusion. Hepatobiliary: Moderate hepatomegaly at 19.2 cm craniocaudal. Marked hepatic steatosis. No focal liver lesion. Cholecystectomy. Mild intrahepatic ductal dilatation after cholecystectomy. Example 7 mm left hepatic duct on image 23/series 4. The common duct is unremarkable after cholecystectomy. Example at 8 mm in the porta hepatis on image 71/ series 5. Tapers minimally within the pancreatic head. No choledocholithiasis. Pancreas: Normal, without mass or ductal dilatation. Spleen: Normal in size, without focal abnormality. Adrenals/Urinary Tract: Normal adrenal glands. Normal kidneys, without hydronephrosis. Stomach/Bowel: Normal stomach and abdominal bowel loops. Vascular/Lymphatic: Normal caliber of the aorta and branch vessels. No retroperitoneal or  retrocrural adenopathy. Other: No ascites. Musculoskeletal: No acute osseous abnormality. IMPRESSION: 1. Status post cholecystectomy. Mild intrahepatic ductal dilatation, without cause identified. No evidence of common duct dilatation or stone. 2. Hepatomegaly and marked hepatic steatosis. Electronically Signed   By: Jeronimo Greaves M.D.   On: 09/19/2015 12:32      Scheduled Meds: . enoxaparin (LOVENOX) injection  40 mg Subcutaneous QHS  . famotidine (PEPCID) IV  20 mg Intravenous Q12H  .  polyethylene glycol  17 g Oral Daily  . potassium chloride  40 mEq Oral Once  . senna-docusate  2 tablet Oral BID   Continuous Infusions: . dextrose 5 % and 0.45% NaCl 50 mL/hr at 09/20/15 0756     LOS: 1 day    Time spent in minutes: 35    Lauri Till, MD [facility] [phone] [email or web address] Password [email or web address] 09/20/2015, 11:14 AM

## 2015-09-20 NOTE — Progress Notes (Signed)
Patient's pain has been unrelieved by both fentanyl 25 mcg and oxycodone IR 10 mg. Patient has been complaining on pain all night. Patient states that the pain medicine is not really helping to relieve pain that it just makes her drowsy. Patient has been given heating packs to help with pain and patient states that the heating packs work temporarily for pain reliever. Patient also has not had a bowel movement in 3 days and thinks that might be contributing to her pain. Paged MD on call. Order for 2 senokot-S tablets written and given to patient. Will continue with plan of care.

## 2015-09-20 NOTE — Progress Notes (Signed)
Patient's IV restarted. Previous IV was leaking and infiltrated, patient states that she told previous nurses that IV needed to be restarted. Patient believes that previous doses of pain medication were not received due to IV leaking. IV restarted in R forearm. Fentanyl 25 mcg given patient states that she now feels somewhat relief from medication. Will continue to monitor.

## 2015-09-21 ENCOUNTER — Encounter (HOSPITAL_COMMUNITY): Payer: Self-pay

## 2015-09-21 ENCOUNTER — Inpatient Hospital Stay (HOSPITAL_COMMUNITY): Payer: Medicaid Other | Admitting: Anesthesiology

## 2015-09-21 ENCOUNTER — Inpatient Hospital Stay (HOSPITAL_COMMUNITY): Payer: Medicaid Other

## 2015-09-21 ENCOUNTER — Ambulatory Visit (HOSPITAL_COMMUNITY): Admit: 2015-09-21 | Payer: Self-pay | Admitting: Gastroenterology

## 2015-09-21 ENCOUNTER — Encounter (HOSPITAL_COMMUNITY)
Admission: EM | Disposition: A | Discharge status: Home / Self Care | Payer: Self-pay | Source: Home / Self Care | Attending: Internal Medicine

## 2015-09-21 HISTORY — PX: ERCP: SHX5425

## 2015-09-21 LAB — COMPREHENSIVE METABOLIC PANEL
ALT: 669 U/L — AB (ref 14–54)
AST: 275 U/L — AB (ref 15–41)
Albumin: 3.9 g/dL (ref 3.5–5.0)
Alkaline Phosphatase: 124 U/L (ref 38–126)
Anion gap: 7 (ref 5–15)
BUN: 6 mg/dL (ref 6–20)
CHLORIDE: 108 mmol/L (ref 101–111)
CO2: 21 mmol/L — AB (ref 22–32)
CREATININE: 0.5 mg/dL (ref 0.44–1.00)
Calcium: 9 mg/dL (ref 8.9–10.3)
GFR calc Af Amer: >60 mL/min (ref >60)
GFR calc non Af Amer: >60 mL/min (ref >60)
Glucose, Bld: 101 mg/dL — ABNORMAL HIGH (ref 65–99)
Potassium: 3.7 mmol/L (ref 3.5–5.1)
SODIUM: 136 mmol/L (ref 135–145)
Total Bilirubin: 5.1 mg/dL — ABNORMAL HIGH (ref 0.3–1.2)
Total Protein: 7.3 g/dL (ref 6.5–8.1)

## 2015-09-21 LAB — GLUCOSE, CAPILLARY: GLUCOSE-CAPILLARY: 91 mg/dL (ref 65–99)

## 2015-09-21 SURGERY — ERCP, WITH INTERVENTION IF INDICATED
Anesthesia: General

## 2015-09-21 MED ORDER — KETOROLAC TROMETHAMINE 30 MG/ML IJ SOLN: 15.0000 mg | Freq: Three times a day (TID) | INTRAMUSCULAR | Status: DC | PRN

## 2015-09-21 MED ORDER — INDOMETHACIN 50 MG RE SUPP
RECTAL | Status: AC
  Filled 2015-09-21: qty 2

## 2015-09-21 MED ORDER — FENTANYL CITRATE (PF) 100 MCG/2ML IJ SOLN
INTRAMUSCULAR | Status: AC
  Filled 2015-09-21: qty 2

## 2015-09-21 MED ORDER — DEXAMETHASONE SODIUM PHOSPHATE 10 MG/ML IJ SOLN
INTRAMUSCULAR | Status: DC | PRN
  Administered 2015-09-21: 10 mg via INTRAVENOUS

## 2015-09-21 MED ORDER — KETOROLAC TROMETHAMINE 15 MG/ML IJ SOLN
15.0000 mg | Freq: Three times a day (TID) | INTRAMUSCULAR | Status: DC | PRN
  Administered 2015-09-21 (×2): 15 mg via INTRAVENOUS
  Filled 2015-09-21 (×2): qty 1

## 2015-09-21 MED ORDER — MIDAZOLAM HCL 5 MG/5ML IJ SOLN
INTRAMUSCULAR | Status: DC | PRN
  Administered 2015-09-21: 2 mg via INTRAVENOUS

## 2015-09-21 MED ORDER — KETOROLAC TROMETHAMINE 30 MG/ML IJ SOLN
30.0000 mg | INTRAMUSCULAR | Status: AC
  Administered 2015-09-21: 30 mg via INTRAVENOUS
  Filled 2015-09-21: qty 1

## 2015-09-21 MED ORDER — PROPOFOL 10 MG/ML IV BOLUS
INTRAVENOUS | Status: DC | PRN
  Administered 2015-09-21: 50 mg via INTRAVENOUS
  Administered 2015-09-21: 30 mg via INTRAVENOUS
  Administered 2015-09-21: 150 mg via INTRAVENOUS

## 2015-09-21 MED ORDER — LIDOCAINE HCL (CARDIAC) 20 MG/ML IV SOLN
INTRAVENOUS | Status: AC
  Filled 2015-09-21: qty 5

## 2015-09-21 MED ORDER — GLUCAGON HCL RDNA (DIAGNOSTIC) 1 MG IJ SOLR
INTRAMUSCULAR | Status: AC
  Filled 2015-09-21: qty 1

## 2015-09-21 MED ORDER — SODIUM CHLORIDE 0.9 % IV SOLN: INTRAVENOUS | Status: DC

## 2015-09-21 MED ORDER — ONDANSETRON HCL 4 MG/2ML IJ SOLN
INTRAMUSCULAR | Status: AC
  Filled 2015-09-21: qty 2

## 2015-09-21 MED ORDER — PROPOFOL 10 MG/ML IV BOLUS
INTRAVENOUS | Status: AC
  Filled 2015-09-21: qty 20

## 2015-09-21 MED ORDER — PROMETHAZINE HCL 25 MG/ML IJ SOLN: 6.2500 mg | INTRAMUSCULAR | Status: DC | PRN

## 2015-09-21 MED ORDER — ONDANSETRON HCL 4 MG/2ML IJ SOLN
INTRAMUSCULAR | Status: DC | PRN
  Administered 2015-09-21: 4 mg via INTRAVENOUS

## 2015-09-21 MED ORDER — LACTATED RINGERS IV SOLN
INTRAVENOUS | Status: DC | PRN
  Administered 2015-09-21: 12:00:00 via INTRAVENOUS

## 2015-09-21 MED ORDER — INDOMETHACIN 50 MG RE SUPP
RECTAL | Status: DC | PRN
Start: 2015-09-21 — End: 2015-09-21
  Administered 2015-09-21: 100 mg via RECTAL

## 2015-09-21 MED ORDER — MIDAZOLAM HCL 2 MG/2ML IJ SOLN
INTRAMUSCULAR | Status: AC
  Filled 2015-09-21: qty 2

## 2015-09-21 MED ORDER — CIPROFLOXACIN IN D5W 400 MG/200ML IV SOLN
INTRAVENOUS | Status: AC
  Filled 2015-09-21: qty 200

## 2015-09-21 MED ORDER — SUCCINYLCHOLINE CHLORIDE 20 MG/ML IJ SOLN
INTRAMUSCULAR | Status: DC | PRN
  Administered 2015-09-21: 100 mg via INTRAVENOUS
  Administered 2015-09-21: 30 mg via INTRAVENOUS

## 2015-09-21 MED ORDER — EPHEDRINE SULFATE 50 MG/ML IJ SOLN
INTRAMUSCULAR | Status: DC | PRN
  Administered 2015-09-21: 10 mg via INTRAVENOUS

## 2015-09-21 MED ORDER — SODIUM CHLORIDE 0.9 % IV SOLN
INTRAVENOUS | Status: DC | PRN
  Administered 2015-09-21: 55 mL

## 2015-09-21 MED ORDER — POLYETHYLENE GLYCOL 3350 17 G PO PACK
17.0000 g | PACK | Freq: Two times a day (BID) | ORAL | Status: DC
  Administered 2015-09-22: 17 g via ORAL
  Filled 2015-09-21 (×3): qty 1

## 2015-09-21 MED ORDER — FENTANYL CITRATE (PF) 100 MCG/2ML IJ SOLN
INTRAMUSCULAR | Status: DC | PRN
  Administered 2015-09-21 (×2): 50 ug via INTRAVENOUS

## 2015-09-21 MED ORDER — LIDOCAINE HCL (CARDIAC) 20 MG/ML IV SOLN
INTRAVENOUS | Status: DC | PRN
  Administered 2015-09-21: 100 mg via INTRAVENOUS

## 2015-09-21 MED ORDER — DEXAMETHASONE SODIUM PHOSPHATE 10 MG/ML IJ SOLN
INTRAMUSCULAR | Status: AC
  Filled 2015-09-21: qty 1

## 2015-09-21 MED ORDER — FENTANYL CITRATE (PF) 100 MCG/2ML IJ SOLN: 25.0000 ug | INTRAMUSCULAR | Status: DC | PRN

## 2015-09-21 MED ORDER — CIPROFLOXACIN IN D5W 400 MG/200ML IV SOLN
INTRAVENOUS | Status: DC | PRN
  Administered 2015-09-21: 400 mg via INTRAVENOUS

## 2015-09-21 NOTE — Progress Notes (Signed)
LCSWA met with patient at bedside and discussed patient and 57 old child safe discharge to shelter. [place] educated patient on CPS having to be involved if the baby was not properly cared for-Shelter-Food. Patient guardedly  reports she has a place to go upon discharge. Patient stated her mother is flying to [employer]. Patient reports she has never been homeless because she has always had a place to stay at night, friends and hotel. Patient says she gets pampers for her baby and has [facility] vouchers.    LCSWA spoke with [facility] Education officer, museum. She expressed patient has been using services with fiance and 88 old child. The [facility] provided the family with three nights in hotel but family only stayed one night.  [facility] no longer helps to place families in shelters, they provide a list. They are following the family helping them to get state ID. The [facility] states the patient [facility] vouchers are from [place], they will need to be switched to [place]. They are also helping to provide patient and fiance with employment.   Kathrin Greathouse, Latanya Presser, MSW Clinical Social Worker 5E and Psychiatric Service Line [phone] 09/21/2015  4:38 PM

## 2015-09-21 NOTE — Progress Notes (Signed)
Patient received morphine @ 2301 after reporting having BM in commode. At 2320 patient reported pain medication had not decreased pain. MD on call contacted who suggested current pain medication regimen be continued. MD paged a second time. MD added 10mg  oxy IR. Patient refused Oxy IR reporting it had been tried already and did not work. MD paged third time. MD ordered 30mg  IV toradol. Administered to patient. Will follow up on relief .

## 2015-09-21 NOTE — Progress Notes (Signed)
PROGRESS NOTE    Margaret Jones  WJX:[id number] DOB: [birth year] DOA: 09/18/2015  PCP: No PCP Per Patient   Brief Narrative:  20/F with prior history of right-sided abdominal pain in [place], underwent laparoscopic cholecystectomy in April in [place], she is 5 months postpartum with and currently homeless, admitted with worsening abdominal pain to 3 days prior to admission . Found to have elevated LFTs, right upper quadrant ultrasound was unremarkable , she underwent an MRCP yesterday this was negative.  For ERCP today.  Pain control appears to be an issue both here and in previous hospitalizations  Subjective -continue to c/o pain---- while "face timing" with her mother and playing with her baby  Assessment & Plan:     Abdominal pain/Elevated LFTs  she has abnormal LFTs and elevated bilirubin(Found to have elevated LFTs this was noted back in April from care everywhere as well )-which could be NASH -Status post laparoscopic cholecystectomy in April [facility] consulting -Right upper quadrant ultrasound and MRCP unremarkable -Continue clear/supportive care/advance diet as tolerated -for ERCP for ? Retained stone  Fatty liver - discussed need to lose weight but question if this may have another cause other than obesity  Anemia - FU anemia, this might be post partum  Constipation -due to narcotics, add senokot and miralax -increase miralax  Pain -changed to a few doses of toradol but patient was instructed to not breastfeed her child as with this med it is contraindicated ? If anxiety and the fact that they are homeless playing a role  DVT prophylaxis: Lovenox  Code Status: full code Family Communication: husband and baby at bedside- spoke with mother on phone Disposition Plan: They are homeless, CSW following, DC pending workup per GI Consultants: [facility]   Procedures: MRCP   Antimicrobials:  Anti-infectives    Start     Dose/Rate Route Frequency Ordered Stop   09/18/15 2359  levofloxacin (LEVAQUIN) IVPB 750 mg  Status:  Discontinued     750 mg 100 mL/hr over 90 Minutes Intravenous Every 24 hours 09/18/15 2331 09/20/15 0743   09/18/15 2230  cefTRIAXone (ROCEPHIN) 1 g in dextrose 5 % 50 mL IVPB     1 g 100 mL/hr over 30 Minutes Intravenous  Once 09/18/15 2219 09/18/15 2301       Objective: Filed Vitals:   09/20/15 1437 09/20/15 2251 09/21/15 0525 09/21/15 1102  BP: 109/52 109/66 116/59 133/77  Pulse: 55 66 58 56  Temp: 98.8 F (37.1 C) 99.4 F (37.4 C) 98.4 F (36.9 C)   TempSrc: Oral Oral Oral Oral  Resp: Height:      Weight:      SpO2: 100% 100% 100% 100%    Intake/Output Summary (Last 24 hours) at 09/21/15 1133 Last data filed at 09/20/15 1843  Gross per 24 hour  Intake      0 ml  Output      0 ml  Net      0 ml   Filed Weights   09/18/15 1846  Weight: 83.008 kg (183 lb)    Examination: General exam: no distress-- c/o pain Cardiovascular system: S1 & S2 heard, RRR.  No murmurs  Gastrointestinal system: Abdomen soft,  -tender in epigastrium and RUQ, nondistended. decrease bowel sound. No organomegaly Central nervous system: Alert and oriented. No focal neurological deficits. Extremities: No cyanosis, clubbing or edema     Data Reviewed: I have personally reviewed following labs and imaging studies  CBC:  Recent Labs Lab  09/18/15 0509 09/18/15 2025 09/19/15 0548 09/20/15 0531  WBC 7.8 5.7 4.8 5.9  NEUTROABS 5.7 4.4  --   --   HGB 11.0* 11.0* 10.9* 10.2*  HCT 34.3* 33.2* 33.6* 31.8*  MCV 80.7 78.3 78.0 80.7  PLT 381 376 340 357   Basic Metabolic Panel:  Recent Labs Lab 09/18/15 0509 09/18/15 2025 09/19/15 0548 09/20/15 0531 09/21/15 0447  NA 137 136 135 136 136  K 3.7 3.7 4.0 3.2* 3.7  CL 106 108 107 105 108  CO2 23 21* 21* 22 21*  GLUCOSE 112* 88 179* 120* 101*  BUN 14 8 6 8 6   CREATININE 0.66 0.58 0.59 0.65 0.50  CALCIUM 8.9 8.8* 8.8* 8.4* 9.0   GFR: Estimated Creatinine  Clearance: 110.2 mL/min (by C-G formula based on Cr of 0.5). Liver Function Tests:  Recent Labs Lab 09/18/15 0509 09/18/15 2025 09/19/15 0548 09/20/15 0531 09/21/15 0447  AST 407* 771* 630* 495* 275*  ALT 206* 742* 787* 801* 669*  ALKPHOS 87 114 126 116 124  BILITOT 0.8 3.5* 4.1* 4.8* 5.1*  PROT 8.2* 7.9 7.8 7.0 7.3  ALBUMIN 4.6 4.4 4.3 3.9 3.9    Recent Labs Lab 09/18/15 0509 09/18/15 2025  LIPASE 38 24   No results for input(s): AMMONIA in the last 168 hours. Coagulation Profile:  Recent Labs Lab 09/19/15 0548  INR 1.13   Cardiac Enzymes: No results for input(s): CKTOTAL, CKMB, CKMBINDEX, TROPONINI in the last 168 hours. BNP (last 3 results) No results for input(s): PROBNP in the last 8760 hours. HbA1C: No results for input(s): HGBA1C in the last 72 hours. CBG:  Recent Labs Lab 09/19/15 0731 09/20/15 0734 09/21/15 0732  GLUCAP 149* 120* 91   Lipid Profile:  Recent Labs  09/20/15 0531  CHOL 127  HDL 48  LDLCALC 62  TRIG 84  CHOLHDL 2.6   Thyroid Function Tests: No results for input(s): TSH, T4TOTAL, FREET4, T3FREE, THYROIDAB in the last 72 hours. Anemia Panel:  Recent Labs  09/20/15 0531  VITAMINB12 835  FOLATE 35.9  FERRITIN 67  TIBC 402  IRON 46  RETICCTPCT 2.3   Urine analysis:    Component Value Date/Time   COLORURINE AMBER* 09/18/2015 2157   APPEARANCEUR CLOUDY* 09/18/2015 2157   LABSPEC 1.019 09/18/2015 2157   PHURINE 7.5 09/18/2015 2157   GLUCOSEU NEGATIVE 09/18/2015 2157   HGBUR NEGATIVE 09/18/2015 2157   BILIRUBINUR SMALL* 09/18/2015 2157   KETONESUR NEGATIVE 09/18/2015 2157   PROTEINUR NEGATIVE 09/18/2015 2157   NITRITE NEGATIVE 09/18/2015 2157   LEUKOCYTESUR SMALL* 09/18/2015 2157    )No results found for this or any previous visit (from the past 240 hour(s)).       Radiology Studies: Mr 3d Recon At Scanner  09/19/2015  CLINICAL DATA:  Cholecystectomy 3 months ago with abdominal pain. Nausea and vomiting.  Elevated liver function tests. EXAM: MRI ABDOMEN WITHOUT AND WITH CONTRAST (INCLUDING MRCP) TECHNIQUE: Multiplanar multisequence MR imaging of the abdomen was performed both before and after the administration of intravenous contrast. Heavily T2-weighted images of the biliary and pancreatic ducts were obtained, and three-dimensional MRCP images were rendered by post processing. CONTRAST:  20mL MULTIHANCE GADOBENATE DIMEGLUMINE 529 MG/ML IV SOLN COMPARISON:  CT and ultrasound of 1 day prior. FINDINGS: Lower chest: Normal heart size without pericardial or pleural effusion. Hepatobiliary: Moderate hepatomegaly at 19.2 cm craniocaudal. Marked hepatic steatosis. No focal liver lesion. Cholecystectomy. Mild intrahepatic ductal dilatation after cholecystectomy. Example 7 mm left hepatic duct on image 23/series 4.  The common duct is unremarkable after cholecystectomy. Example at 8 mm in the porta hepatis on image 71/ series 5. Tapers minimally within the pancreatic head. No choledocholithiasis. Pancreas: Normal, without mass or ductal dilatation. Spleen: Normal in size, without focal abnormality. Adrenals/Urinary Tract: Normal adrenal glands. Normal kidneys, without hydronephrosis. Stomach/Bowel: Normal stomach and abdominal bowel loops. Vascular/Lymphatic: Normal caliber of the aorta and branch vessels. No retroperitoneal or retrocrural adenopathy. Other: No ascites. Musculoskeletal: No acute osseous abnormality. IMPRESSION: 1. Status post cholecystectomy. Mild intrahepatic ductal dilatation, without cause identified. No evidence of common duct dilatation or stone. 2. Hepatomegaly and marked hepatic steatosis. Electronically Signed   By: Jeronimo GreavesKyle  Talbot M.D.   On: 09/19/2015 12:32   Mr Roe Coombsbd W/wo Cm/mrcp  09/19/2015  CLINICAL DATA:  Cholecystectomy 3 months ago with abdominal pain. Nausea and vomiting. Elevated liver function tests. EXAM: MRI ABDOMEN WITHOUT AND WITH CONTRAST (INCLUDING MRCP) TECHNIQUE: Multiplanar  multisequence MR imaging of the abdomen was performed both before and after the administration of intravenous contrast. Heavily T2-weighted images of the biliary and pancreatic ducts were obtained, and three-dimensional MRCP images were rendered by post processing. CONTRAST:  20mL MULTIHANCE GADOBENATE DIMEGLUMINE 529 MG/ML IV SOLN COMPARISON:  CT and ultrasound of 1 day prior. FINDINGS: Lower chest: Normal heart size without pericardial or pleural effusion. Hepatobiliary: Moderate hepatomegaly at 19.2 cm craniocaudal. Marked hepatic steatosis. No focal liver lesion. Cholecystectomy. Mild intrahepatic ductal dilatation after cholecystectomy. Example 7 mm left hepatic duct on image 23/series 4. The common duct is unremarkable after cholecystectomy. Example at 8 mm in the porta hepatis on image 71/ series 5. Tapers minimally within the pancreatic head. No choledocholithiasis. Pancreas: Normal, without mass or ductal dilatation. Spleen: Normal in size, without focal abnormality. Adrenals/Urinary Tract: Normal adrenal glands. Normal kidneys, without hydronephrosis. Stomach/Bowel: Normal stomach and abdominal bowel loops. Vascular/Lymphatic: Normal caliber of the aorta and branch vessels. No retroperitoneal or retrocrural adenopathy. Other: No ascites. Musculoskeletal: No acute osseous abnormality. IMPRESSION: 1. Status post cholecystectomy. Mild intrahepatic ductal dilatation, without cause identified. No evidence of common duct dilatation or stone. 2. Hepatomegaly and marked hepatic steatosis. Electronically Signed   By: Jeronimo GreavesKyle  Talbot M.D.   On: 09/19/2015 12:32      Scheduled Meds: . [MAR Hold] enoxaparin (LOVENOX) injection  40 mg Subcutaneous QHS  . [MAR Hold] famotidine (PEPCID) IV  20 mg Intravenous Q12H  . oxyCODONE  10 mg Oral Once  . [MAR Hold] polyethylene glycol  17 g Oral BID  . [MAR Hold] senna-docusate  2 tablet Oral BID   Continuous Infusions: . sodium chloride    . dextrose 5 % and 0.45%  NaCl 50 mL/hr at 09/21/15 0739     LOS: 2 days    Time spent in minutes: 25    Atha Mcbain U Mariena Meares, DO [facility] [phone] [email or web address] Password [email or web address] 09/21/2015, 11:33 AM

## 2015-09-21 NOTE — Anesthesia Preprocedure Evaluation (Addendum)
Anesthesia Evaluation  Patient identified by MRN, date of birth, ID band Patient awake    Reviewed: Allergy & Precautions, NPO status , Patient's Chart, lab work & pertinent test results  Airway Mallampati: II  TM Distance: >3 FB Neck ROM: Full    Dental no notable dental hx.    Pulmonary neg pulmonary ROS,    Pulmonary exam normal breath sounds clear to auscultation       Cardiovascular negative cardio ROS Normal cardiovascular exam Rhythm:Regular Rate:Normal     Neuro/Psych negative neurological ROS  negative psych ROS   GI/Hepatic negative GI ROS, Neg liver ROS,   Endo/Other  negative endocrine ROS  Renal/GU negative Renal ROS  negative genitourinary   Musculoskeletal negative musculoskeletal ROS (+)   Abdominal (+) + obese,   Peds negative pediatric ROS (+)  Hematology negative hematology ROS (+)   Anesthesia Other Findings   Reproductive/Obstetrics negative OB ROS She denies pregnancy. LMP 08-30-15                            Anesthesia Physical Anesthesia Plan  ASA: II  Anesthesia Plan: General   Post-op Pain Management:    Induction: Intravenous  Airway Management Planned: Oral ETT  Additional Equipment:   Intra-op Plan:   Post-operative Plan: Extubation in OR  Informed Consent: I have reviewed the patients History and Physical, chart, labs and discussed the procedure including the risks, benefits and alternatives for the proposed anesthesia with the patient or authorized representative who has indicated his/her understanding and acceptance.   Dental advisory given  Plan Discussed with: CRNA  Anesthesia Plan Comments: (Patient breastfeeding. Will be advised to bottle feed for 24 hours.)       Anesthesia Quick Evaluation

## 2015-09-21 NOTE — Op Note (Signed)
[facility] Patient Name: Margaret Jones Procedure Date: 09/21/2015 MRN: [id number] Attending MD: Vida RiggerMarc Jessee Newnam , MD Date of Birth: 02-16-1996 CSN: [id number] Age: [age] Admit Type: Inpatient Procedure:                ERCP Indications:              Suspected bile duct stone(s) Providers:                Vida RiggerMarc Jaylean Buenaventura, MD, Omelia BlackwaterShelby Carpenter, RN, [facility] Aiken                            [place], Technician, Maricela CuretLaura Peters, CRNA Referring MD:              Medicines:                General Anesthesia Complications:            No immediate complications. Estimated Blood Loss:     Estimated blood loss: none. Procedure:                Pre-Anesthesia Assessment:                           - Prior to the procedure, a History and Physical                            was performed, and patient medications and                            allergies were reviewed. The patient's tolerance of                            previous anesthesia was also reviewed. The risks                            and benefits of the procedure and the sedation                            options and risks were discussed with the patient.                            All questions were answered, and informed consent                            was obtained. Prior Anticoagulants: The patient has                            taken no previous anticoagulant or antiplatelet                            agents. ASA Grade Assessment: I - A normal, healthy                            patient. After reviewing the risks and benefits,  the patient was deemed in satisfactory condition to                            undergo the procedure.                           After obtaining informed consent, the scope was                            passed under direct vision. Throughout the                            procedure, the patient's blood pressure, pulse, and                            oxygen saturations were monitored  continuously. The                            ZO-[id number] ([id number]) scope was introduced through                            the mouth, and used to inject contrast into and                            used to locate the major papilla. The ERCP was                            somewhat difficult due to challenging cannulation.                            Successful completion of the procedure was aided by                            performing the maneuvers documented (below) in this                            report. The patient tolerated the procedure well. Scope In: Scope Out: Findings:      a bulbous ampulla was found,and unfortunately the wire repeatedly went       towards the pancreas so we placed a 5 Fr by 3 cm plastic stent with a       single internal pigtail was placed into the ventral pancreatic duct. The       stent was in good position. we then were able to cannulate the CBD which       was slightly dilated and we believe one stone was seen on the initial       cholangiogram and we then proceeded with a Biliary sphincterotomy was       made with a Hydratome sphincterotome using ERBE electrocautery until we       could get the fully bowed sphincterotome easily in and out of the duct       and did have some adequate dye drainage. There was no       post-sphincterotomy bleeding. Choledocholithiasis was found in a       nondilated duct. The  main bile duct contained one stone which was       removed using the adjustable 12-15 mm balloon in the customary fashion       and multiple subsequent balloon pull-through's were donethrough both the       left and right systems and no residual stone was seen and we proceeded       with an occlusion cholangiogram in the customary fashion and again no       residual was seen and the balloon was withdrawn through the patent       sphincterotomy site and the wire was removed and there was adequate       biliary drainage and we elected to stop the  procedure at this point Impression:               - Choledocholithiasis was found. Removal by biliary                            sphincterotomy was not accomplished                           - One plastic stent was placed into the ventral                            pancreatic duct 28 in cannulation and decrease the                            risk of pancreatitis.                           - A biliary sphincterotomy was performed.and there                            was adequate drainage at the end of the procedure                            and no obvious residual stones on occlusion                            cholangiogram Moderate Sedation:      N/A- Per Anesthesia Care Recommendation:           - Avoid aspirin and nonsteroidal anti-inflammatory                            medicines for 3 days.                           - Continue present medications. hold breast-feeding                            as long as on narcotics and for 24 hours to 48                            hours after anesthesia                           - Return  to GI clinic in 1 week. to repeat liver                            tests and make sure they are decreasing                           - Telephone GI clinic if symptomatic after studies                            are complete.                           - Perform a flat plate and upright abdominal x-ray                            in 5 days to make sure pancreatic duct stent passes                            on its own. Procedure Code(s):        --- Professional ---                           [phone], Esophagogastroduodenoscopy, flexible,                            transoral; diagnostic, including collection of                            specimen(s) by brushing or washing, when performed                            (separate procedure) Diagnosis Code(s):        --- Professional ---                           K80.50, Calculus of bile duct without cholangitis                             or cholecystitis without obstruction CPT copyright 2016 American Medical Association. All rights reserved. The codes documented in this report are preliminary and upon coder review may  be revised to meet current compliance requirements. Vida RiggerMarc Jayen Bromwell, MD 09/21/2015 1:25:53 PM This report has been signed electronically. Number of Addenda: 0

## 2015-09-21 NOTE — Anesthesia Procedure Notes (Signed)
Procedure Name: Intubation Date/Time: 09/21/2015 12:23 PM Performed by: Orest DikesPETERS, Sira Adsit J Pre-anesthesia Checklist: Patient identified, Emergency Drugs available, Suction available and Patient being monitored Patient Re-evaluated:Patient Re-evaluated prior to inductionOxygen Delivery Method: Circle system utilized Preoxygenation: Pre-oxygenation with 100% oxygen Intubation Type: IV induction Ventilation: Mask ventilation without difficulty Laryngoscope Size: Mac and 4 Grade View: Grade I Tube type: Oral Tube size: 7.0 mm Number of attempts: 1 Airway Equipment and Method: Stylet Placement Confirmation: ETT inserted through vocal cords under direct vision,  positive ETCO2 and breath sounds checked- equal and bilateral Secured at: 21 cm Tube secured with: Tape Dental Injury: Teeth and Oropharynx as per pre-operative assessment

## 2015-09-21 NOTE — Progress Notes (Signed)
Margaret Jones 11:21 AM  Subjective: Patient seen and examined and hospital computer chart reviewed and case discussed with my partner Dr. Evette Jones and she has no new complaints  Objective: Vital signs stable afebrile no acute distress exam please see preassessment evaluation still little midepigastric right upper quadrant tenderness mild no guarding and rebound soft good bowel sounds labs reviewed ultrasound CT MRI reviewed  Assessment: Probable residual CBD stone despite negative x-rays  Plan: The risks benefits methods and success rate of ERCP sphincterotomy and stone removal was discussed with the patient and will proceed later today with anesthesia assistance with further workup and plans pending those findings  [facility] CenterMAGOD,Margaret Jones E  Pager [phone] After 5PM or if no answer call [phone]

## 2015-09-21 NOTE — Anesthesia Postprocedure Evaluation (Signed)
Anesthesia Post Note  Patient: Margaret Jones  Procedure(s) Performed: Procedure(s) (LRB): ENDOSCOPIC RETROGRADE CHOLANGIOPANCREATOGRAPHY (ERCP) (N/A)  Patient location during evaluation: PACU Anesthesia Type: General Level of consciousness: awake and alert Pain management: pain level controlled Vital Signs Assessment: post-procedure vital signs reviewed and stable Respiratory status: spontaneous breathing, nonlabored ventilation, respiratory function stable and patient connected to nasal cannula oxygen Cardiovascular status: blood pressure returned to baseline and stable Postop Assessment: no signs of nausea or vomiting Anesthetic complications: no Comments: Bottle feed for at least 24 hours discussed with patient.    Last Vitals:  Filed Vitals:   09/21/15 1437 09/21/15 1552  BP: 131/70 123/60  Pulse: 61 58  Temp: 36.5 C 36.9 C  Resp: 16 18    Last Pain:  Filed Vitals:   09/21/15 1902  PainSc: 2                  Alam Guterrez J

## 2015-09-21 NOTE — Transfer of Care (Signed)
Immediate Anesthesia Transfer of Care Note  Patient: Margaret Jones  Procedure(s) Performed: Procedure(s): ENDOSCOPIC RETROGRADE CHOLANGIOPANCREATOGRAPHY (ERCP) (N/A)  Patient Location: PACU  Anesthesia Type:General  Level of Consciousness:  sedated, patient cooperative and responds to stimulation  Airway & Oxygen Therapy:Patient Spontanous Breathing and Patient connected to face mask oxgen  Post-op Assessment:  Report given to PACU RN and Post -op Vital signs reviewed and stable  Post vital signs:  Reviewed and stable  Last Vitals:  Filed Vitals:   09/21/15 0525 09/21/15 1102  BP: 116/59 133/77  Pulse: 58 56  Temp: 36.9 C   Resp: 16 17    Complications: No apparent anesthesia complications

## 2015-09-22 DIAGNOSIS — K8050 Calculus of bile duct without cholangitis or cholecystitis without obstruction: Secondary | ICD-10-CM

## 2015-09-22 LAB — COMPREHENSIVE METABOLIC PANEL
ALBUMIN: 4 g/dL (ref 3.5–5.0)
ALT: 553 U/L — ABNORMAL HIGH (ref 14–54)
AST: 251 U/L — AB (ref 15–41)
Alkaline Phosphatase: 126 U/L (ref 38–126)
Anion gap: 8 (ref 5–15)
BILIRUBIN TOTAL: 3.1 mg/dL — AB (ref 0.3–1.2)
BUN: 9 mg/dL (ref 6–20)
CO2: 22 mmol/L (ref 22–32)
Calcium: 9.2 mg/dL (ref 8.9–10.3)
Chloride: 106 mmol/L (ref 101–111)
Creatinine, Ser: 0.61 mg/dL (ref 0.44–1.00)
GFR calc Af Amer: >60 mL/min (ref >60)
GFR calc non Af Amer: >60 mL/min (ref >60)
GLUCOSE: 112 mg/dL — AB (ref 65–99)
POTASSIUM: 3.5 mmol/L (ref 3.5–5.1)
SODIUM: 136 mmol/L (ref 135–145)
TOTAL PROTEIN: 7.3 g/dL (ref 6.5–8.1)

## 2015-09-22 LAB — CBC WITH DIFFERENTIAL/PLATELET
BASOS ABS: 0 10*3/uL (ref 0.0–0.1)
Basophils Relative: 0 %
EOS PCT: 0 %
Eosinophils Absolute: 0 10*3/uL (ref 0.0–0.7)
HEMATOCRIT: 32.9 % — AB (ref 36.0–46.0)
Hemoglobin: 10.8 g/dL — ABNORMAL LOW (ref 12.0–15.0)
LYMPHS ABS: 1.9 10*3/uL (ref 0.7–4.0)
LYMPHS PCT: 21 %
MCH: 26 pg (ref 26.0–34.0)
MCHC: 32.8 g/dL (ref 30.0–36.0)
MCV: 79.1 fL (ref 78.0–100.0)
MONO ABS: 0.7 10*3/uL (ref 0.1–1.0)
MONOS PCT: 8 %
NEUTROS ABS: 6.5 10*3/uL (ref 1.7–7.7)
Neutrophils Relative %: 71 %
PLATELETS: 354 10*3/uL (ref 150–400)
RBC: 4.16 MIL/uL (ref 3.87–5.11)
RDW: 14.5 % (ref 11.5–15.5)
WBC: 9.1 10*3/uL (ref 4.0–10.5)

## 2015-09-22 LAB — GLUCOSE, CAPILLARY: Glucose-Capillary: 124 mg/dL — ABNORMAL HIGH (ref 65–99)

## 2015-09-22 NOTE — Progress Notes (Signed)
Pt found in room with IV tubing disconnected from saline lock and in the shower. Pt educated to call the nurse if IV tubing needed to be disconnected and prior to getting in the shower. Explained to the pt that not properly maintaining IV site and fluids could lead to increased risk for infection. Pt verbalized understanding. IV site checked and blood return noted. Pt reconnected to IV fluids.

## 2015-09-22 NOTE — Discharge Summary (Signed)
Physician Discharge Summary  Patient ID: Margaret Jones MRN: [id number] DOB/AGE: November 07, 1995 20 y.o.  Admit date: 09/18/2015 Discharge date: 09/22/2015  Admission Diagnoses:  Discharge Diagnoses:  Active Problems:   Abnormal LFTs   Abdominal pain   Discharged Condition: stable  Hospital Course: 20 yo female with history of cholecystectomy 3 months ago comes with complaints of abdominal pain in epigastric/ and right upper quadrant area, with associated nausea and vomiting. Patient was admitted for further assessment and management. Work up revealed diffuse fatty infiltration of liver and mild extrahepatic and central intrahepatic biliary dilatation post cholecystectomy. ERCP revealed choledocholithiasis, pancreatic stent was placed. Patient will be discharged home today, and follow up with GI for further management of the pancreatic stent. Patient's symptoms have resolved.  Consults: GI  Significant Diagnostic Studies: ERCP  Discharge Medication - Please see Med Rec.  Discharge Exam: Blood pressure 121/79, pulse 69, temperature 98.4 F (36.9 C), temperature source Oral, resp. rate 16, height 5\' 2"  (1.575 m), weight 83.008 kg (183 lb), last menstrual period 08/30/2015, SpO2 100 %.  Disposition: 01-Home or Self Care  Discharge Instructions    Diet - low sodium heart healthy    Complete by:  As directed      Discharge instructions    Complete by:  As directed   Follow up with PCP within one week. Follow up with GI as soon as possible     Increase activity slowly    Complete by:  As directed             Medication List    STOP taking these medications        ondansetron 4 MG disintegrating tablet  Commonly known as:  ZOFRAN ODT      TAKE these medications        ibuprofen 400 MG tablet  Commonly known as:  ADVIL,MOTRIN  Take 400 mg by mouth every 6 (six) hours as needed for mild pain.     ranitidine 150 MG tablet  Commonly known as:  ZANTAC  Take 1 tablet (150 mg  total) by mouth 2 (two) times daily.     sucralfate 1 g tablet  Commonly known as:  CARAFATE  Take 1 tablet (1 g total) by mouth 4 (four) times daily.         SignedBarnetta Chapel: Mckinzie Saksa I Ailyne Pawley 09/22/2015, 2:37 PM

## 2015-09-22 NOTE — Progress Notes (Addendum)
LCSWA met with patient at bedside, fiance and baby present as well. Patient reported she was not able to get in contact with family in [place] . Patient family in [place] denied patient to stay with with her as well, and demanded patient to get her belongings from her home.Patient was able to contact family in [place], whom agreed to support them at this time.  LCSWA called [facility] and arranged for patient and family to be transported to [place]. Patient given twenty dollars petty cash for food. 77 old baby has formula/bottles for trip. Patient was given taxi voucher to first location to grab belongings, and second destination to [employer] [place] for transport. Patient and husband both have photo ID's.   Kathrin Greathouse, Latanya Presser, MSW Clinical Social Worker 5E and Psychiatric Service Line [phone] 09/22/2015  4:55 PM

## 2015-09-22 NOTE — Progress Notes (Signed)
LCSWA met with patient at bedside, 61 old and fiance present. Patient reported she  has family in [place] that will allow her and family to stay in there home. Patient reports she also has family in [place] as well. Patient is waiting for a contact number to arrange transportation, as her fiance has been communicating via messenger.  Kathrin Greathouse, Latanya Presser, MSW Clinical Social Worker 5E and Psychiatric Service Line [phone] 09/22/2015  11:35 AM

## 2015-09-22 NOTE — Progress Notes (Signed)
[facility] Progress Note  Subjective: The patient is feeling well today. She has no more complaints of abdominal pain since her ERCP and sphincterotomy with stone extraction. She is eating breakfast.  Objective: Vital signs in last 24 hours: Temp:  [97.7 F (36.5 C)-98.8 F (37.1 C)] 98.3 F (36.8 C) (06/23 0504) Pulse Rate:  [54-100] 84 (06/23 0504) Resp:  [11-23] 16 (06/23 0504) BP: (110-147)/(51-87) 110/56 mmHg (06/23 0504) SpO2:  [99 %-100 %] 100 % (06/23 0504) Weight change:    PE:  No distress  Abdomen soft nontender  Lab Results: Results for orders placed or performed during the hospital encounter of 09/18/15 (from the past 24 hour(s))  Comprehensive metabolic panel     Status: Abnormal   Collection Time: 09/22/15  5:21 AM  Result Value Ref Range   Sodium 136 135 - 145 mmol/L   Potassium 3.5 3.5 - 5.1 mmol/L   Chloride 106 101 - 111 mmol/L   CO2 22 22 - 32 mmol/L   Glucose, Bld 112 (H) 65 - 99 mg/dL   BUN 9 6 - 20 mg/dL   Creatinine, Ser 1.610.61 0.44 - 1.00 mg/dL   Calcium 9.2 8.9 - 09.610.3 mg/dL   Total Protein 7.3 6.5 - 8.1 g/dL   Albumin 4.0 3.5 - 5.0 g/dL   AST 045251 (H) 15 - 41 U/L   ALT 553 (H) 14 - 54 U/L   Alkaline Phosphatase 126 38 - 126 U/L   Total Bilirubin 3.1 (H) 0.3 - 1.2 mg/dL   GFR calc non Af Amer >60 >60 mL/min   GFR calc Af Amer >60 >60 mL/min   Anion gap 8 5 - 15  CBC with Differential/Platelet     Status: Abnormal   Collection Time: 09/22/15  5:21 AM  Result Value Ref Range   WBC 9.1 4.0 - 10.5 K/uL   RBC 4.16 3.87 - 5.11 MIL/uL   Hemoglobin 10.8 (L) 12.0 - 15.0 g/dL   HCT 40.932.9 (L) 81.136.0 - 91.446.0 %   MCV 79.1 78.0 - 100.0 fL   MCH 26.0 26.0 - 34.0 pg   MCHC 32.8 30.0 - 36.0 g/dL   RDW 78.214.5 95.611.5 - 21.315.5 %   Platelets 354 150 - 400 K/uL   Neutrophils Relative % 71 %   Neutro Abs 6.5 1.7 - 7.7 K/uL   Lymphocytes Relative 21 %   Lymphs Abs 1.9 0.7 - 4.0 K/uL   Monocytes Relative 8 %   Monocytes Absolute 0.7 0.1 - 1.0 K/uL   Eosinophils Relative 0 %   Eosinophils Absolute 0.0 0.0 - 0.7 K/uL   Basophils Relative 0 %   Basophils Absolute 0.0 0.0 - 0.1 K/uL  Glucose, capillary     Status: Abnormal   Collection Time: 09/22/15  7:39 AM  Result Value Ref Range   Glucose-Capillary 124 (H) 65 - 99 mg/dL    Studies/Results: Dg Ercp Biliary & Pancreatic Ducts  09/21/2015  CLINICAL DATA:  20 year old female with a history of possible choledocholithiasis EXAM: ERCP TECHNIQUE: Multiple spot images obtained with the fluoroscopic device and submitted for interpretation post-procedure. FLUOROSCOPY TIME:  9 minutes 38 seconds COMPARISON:  MR 09/19/2015, CT 09/18/2015 FINDINGS: Limited intraoperative fluoroscopic spot images during ERCP. Initial image demonstrates scope projecting over the upper abdomen with cannulation of the ampulla. Subsequently there is retrograde infusion of contrast within the extrahepatic biliary ducts. A balloon basket is visualized within the ducts. Incomplete visualization of the extrahepatic biliary ducts. Final image demonstrates apparent biliary stent placement.  IMPRESSION: Limited images during ERCP, with partial visualization of the extrahepatic ducts an apparent biliary stent placement. These images were submitted for radiologic interpretation only. Please see the procedural report for the amount of contrast, procedural findings, and the fluoroscopy time utilized. Signed, Yvone NeuJaime S. Loreta AveWagner, DO [facility] Electronically Signed   By: Gilmer MorJaime  Wagner D.O.   On: 09/21/2015 14:25      Assessment: Choledocholithiasis. Status post ERCP with sphincterotomy and stone extraction.  Plan:   The patient is doing well. There has been some improvement in her LFTs. She has no more abdominal pain. I think she can be discharged. She needs to follow-up in about a week with Dr. Ewing SchleinMagod. She has a pancreatic stent that was placed during the procedure. He will deal  with that when she follows up with him. We will sign off. Call us if needed.    Gwenevere AbbotSAM F Lei Dower 09/22/2015, 9:37 AM  Pager: [phone] If no answer or after 5 PM call [phone]

## 2015-10-29 ENCOUNTER — Emergency Department (HOSPITAL_COMMUNITY)
Admission: EM | Admit: 2015-10-29 | Discharge: 2015-10-29 | Disposition: A | Discharge status: Home / Self Care | Payer: Medicaid Other | Attending: Emergency Medicine | Admitting: Emergency Medicine

## 2015-10-29 ENCOUNTER — Encounter (HOSPITAL_COMMUNITY): Payer: Self-pay

## 2015-10-29 DIAGNOSIS — Y929 Unspecified place or not applicable: Secondary | ICD-10-CM | POA: Insufficient documentation

## 2015-10-29 DIAGNOSIS — R21 Rash and other nonspecific skin eruption: Secondary | ICD-10-CM | POA: Diagnosis not present

## 2015-10-29 DIAGNOSIS — Z791 Long term (current) use of non-steroidal anti-inflammatories (NSAID): Secondary | ICD-10-CM | POA: Insufficient documentation

## 2015-10-29 DIAGNOSIS — W57XXXA Bitten or stung by nonvenomous insect and other nonvenomous arthropods, initial encounter: Secondary | ICD-10-CM | POA: Diagnosis not present

## 2015-10-29 DIAGNOSIS — Y939 Activity, unspecified: Secondary | ICD-10-CM | POA: Insufficient documentation

## 2015-10-29 DIAGNOSIS — Y999 Unspecified external cause status: Secondary | ICD-10-CM | POA: Diagnosis not present

## 2015-10-29 MED ORDER — LORATADINE 10 MG PO TABS: 10.0000 mg | ORAL_TABLET | Freq: Every day | ORAL | 0 refills | Status: DC

## 2015-10-29 MED ORDER — HYDROCORTISONE 1 % EX CREA: TOPICAL_CREAM | CUTANEOUS | 0 refills | Status: DC

## 2015-10-29 MED ORDER — LORATADINE 10 MG PO TABS
10.0000 mg | ORAL_TABLET | Freq: Every day | ORAL | Status: DC
  Administered 2015-10-29: 10 mg via ORAL
  Filled 2015-10-29: qty 1

## 2015-10-29 NOTE — Discharge Instructions (Signed)
Read the information below.   You have a rash, this may be bed buts. You were given medicine in the ED for itching. I have prescribed the medication, loratidine, for itch relief. It peaks two hours after taking, if breastfeeding at that time use formula. Take as prescribed. You can also use hydrocortisone cream if itching not relieved with loratadine. Use formula if using hydrocortisone cream.   Be sure to wash all clothing and bedding hot water and dry on hot. The mattress and room need to be treated as well.  Use the prescribed medication as directed.  Please discuss all new medications with your pharmacist.   It is important to follow up with a primary care provider. If you do not have a primary care provider I have provided the contact information above for [facility].  You may return to the Emergency Department at any time for worsening condition or any new symptoms that concern you. Return to ED if you develop fever, drainage from bites, difficulty swallowing, difficulty breathing, or lesions in your mouth.

## 2015-10-29 NOTE — ED Triage Notes (Signed)
Pt here with insect bites to entire body.  Pt is from a shelter.  Pt heard in shelter that corner beds had bugs.  They were told it was mosquito.  They have not brought anyone in to treat area. However, pt also with similar bites over body.  Itching and burning.

## 2015-10-29 NOTE — ED Provider Notes (Signed)
[facility]-EMERGENCY DEPT Provider Note   CSN: [id number] Arrival date & time: 10/29/15  1403  First Provider Contact:  None       History   Chief Complaint Chief Complaint  Patient presents with  . Insect Bite    HPI Margaret Jones is a 20 y.o. female.  Margaret Jones is a 20 y.o. female presents to ED with concern for bed bugs. She states she is currently staying at a shelter. She reports that an individual staying in the room next to her had suspected beg bugs. The individual treated their belongings and was moved to a different room. The room was inspected and told there were bed bugs; however, not treatment/control measures were completed. She now reports a diffuse rash on are arms, trunk, and neck. She reports more bites arise in the AM. She states she killed a bed bug this morning on her neck and that she looked at her mattress and saw a bed bug. Her significant other and child, who also stay at the shelter, also have bites. She reports other individuals at the shelter have bites as well. She describes the rash as extremely pruritic. She has tried drying her sheets in the dryer and applying alcohol to the rash. She denies oral lesions. No fever. No trouble swallowing. No trouble breathing. No purulent drainage. No arthralgias/myalgias.       Past Medical History:  Diagnosis Date  . Gall stones     Patient Active Problem List   Diagnosis Date Noted  . Abnormal LFTs 09/18/2015  . Abdominal pain 09/18/2015    Past Surgical History:  Procedure Laterality Date  . CESAREAN SECTION    . CHOLECYSTECTOMY    . ERCP N/A 09/21/2015   Procedure: ENDOSCOPIC RETROGRADE CHOLANGIOPANCREATOGRAPHY (ERCP);  Surgeon: Vida Rigger, MD;  Location: [facility];  Service: Endoscopy;  Laterality: N/A;    OB History    No data available       Home Medications    Prior to Admission medications   Medication Sig Start Date End Date Taking? Authorizing Provider  naproxen sodium (ANAPROX) 220 MG  tablet Take 440 mg by mouth 2 (two) times daily as needed (pain).   Yes Historical Provider, MD  hydrocortisone cream 1 % Apply to affected area 10/29/15   Lona Kettle, PA-C  ibuprofen (ADVIL,MOTRIN) 400 MG tablet Take 400 mg by mouth every 6 (six) hours as needed for mild pain.    Historical Provider, MD  loratadine (CLARITIN) 10 MG tablet Take 1 tablet (10 mg total) by mouth daily. 10/29/15   Lona Kettle, PA-C  ranitidine (ZANTAC) 150 MG tablet Take 1 tablet (150 mg total) by mouth 2 (two) times daily. Patient not taking: Reported on 10/29/2015 09/18/15   Lorre Nick, MD  sucralfate (CARAFATE) 1 g tablet Take 1 tablet (1 g total) by mouth 4 (four) times daily. 09/18/15   Lorre Nick, MD    Family History History reviewed. No pertinent family history.  Social History Social History  Substance Use Topics  . Smoking status: Never Smoker  . Smokeless tobacco: Never Used  . Alcohol use No     Allergies   Rocephin [ceftriaxone sodium in dextrose]   Review of Systems Review of Systems  Constitutional: Negative for fever.  HENT: Negative for trouble swallowing.   Respiratory: Negative for shortness of breath.   Musculoskeletal: Negative for arthralgias and myalgias.  Skin: Positive for rash.     Physical Exam Updated Vital Signs BP 123/77 (BP Location:  Right Arm)   Pulse 105   Temp 98.8 F (37.1 C) (Oral)   Resp 18   Ht  (1.6 m)   Wt 81.6 kg   LMP 10/22/2015   SpO2 100%   BMI 31.89 kg/m   Physical Exam  Constitutional: She appears well-developed and well-nourished. No distress.  HENT:  Head: Normocephalic and atraumatic.  Mouth/Throat: Oropharynx is clear and moist and mucous membranes are normal. No oral lesions. No trismus in the jaw. No oropharyngeal exudate.  No trismus. No oral swelling. No oral lesion.   Eyes: Conjunctivae are normal. Right eye exhibits no discharge. Left eye exhibits no discharge.  Neck: Normal range of motion and phonation  normal.  Cardiovascular: Regular rhythm and normal heart sounds.  Tachycardia present.   Pulmonary/Chest: Effort normal and breath sounds normal. No stridor. No respiratory distress. She has no wheezes.  Musculoskeletal: Normal range of motion.  Neurological: She is alert.  Skin: Skin is warm and dry. Rash noted. She is not diaphoretic.  Diffuse erythematous papular lesions noted on arms, trunk, and neck with mild surrounding swelling of some papules. No purulent discharge noted.   Psychiatric: She has a normal mood and affect. Her behavior is normal.    ED Treatments / Results  Labs (all labs ordered are listed, but only abnormal results are displayed) Labs Reviewed - No data to display  EKG  EKG Interpretation None       Radiology No results found.  Procedures Procedures (including critical care time)  Medications Ordered in ED Medications  loratadine (CLARITIN) tablet 10 mg (10 mg Oral Given 10/29/15 1627)     Initial Impression / Assessment and Plan / ED Course  I have reviewed the triage vital signs and the nursing notes.  Pertinent labs & imaging results that were available during my care of the patient were reviewed by me and considered in my medical decision making (see chart for details).  Clinical Course    Patient is afebrile and non-toxic appearing in NAD. Vital signs show mild tachycardia, otherwise stable. Physical exam remarkable for multiple diffuse erythematous papular lesions on arms, trunk, and neck with mild surrounding swelling. No oral swelling. No oral lesions. No stridor. Lungs are clear to auscultation. Given history and exam suspect bed bugs. Antihistamine and topical steroid cream for sxs relief, discussed need to use formula if using steroid cream and to not breast feed at 2hrs after taking loratadine. Discussed control measures; unfortunately, these measures are limited as patient is staying at a shelter. Return precautions provided. Contact  information for [facility] provided. Patient voiced understanding and is agreeable.   Final Clinical Impressions(s) / ED Diagnoses   Final diagnoses:  Rash    New Prescriptions Discharge Medication List as of 10/29/2015  4:09 PM    START taking these medications   Details  hydrocortisone cream 1 % Apply to affected area, Print    loratadine (CLARITIN) 10 MG tablet Take 1 tablet (10 mg total) by mouth daily., Starting Sun 10/29/2015, Print         Lona Kettle, PA-C 10/29/15 1720    Doug Sou, MD 10/30/15 [phone]

## 2015-11-09 ENCOUNTER — Emergency Department (HOSPITAL_COMMUNITY)
Admission: EM | Admit: 2015-11-09 | Discharge: 2015-11-09 | Disposition: A | Discharge status: Home / Self Care | Payer: Medicaid Other | Attending: Emergency Medicine | Admitting: Emergency Medicine

## 2015-11-09 ENCOUNTER — Encounter (HOSPITAL_COMMUNITY): Payer: Self-pay | Admitting: Emergency Medicine

## 2015-11-09 DIAGNOSIS — F410 Panic disorder [episodic paroxysmal anxiety] without agoraphobia: Secondary | ICD-10-CM | POA: Diagnosis present

## 2015-11-09 DIAGNOSIS — Z79899 Other long term (current) drug therapy: Secondary | ICD-10-CM | POA: Diagnosis not present

## 2015-11-09 LAB — PREGNANCY, URINE: PREG TEST UR: NEGATIVE

## 2015-11-09 NOTE — Discharge Instructions (Signed)
Please follow up with a primary care physician in regards to today's diagnosis.  Return to ER for chest pain, difficulty breathing, new or worsening symptoms, any additional concerns.

## 2015-11-09 NOTE — ED Triage Notes (Signed)
Pt states she woke up this morning with feeling of fast heart rate, shortness or breath and shaky hands. Symptoms now relieved at triage. States the anxiety attacks began after having her child. NAD at triage.

## 2015-11-09 NOTE — ED Provider Notes (Signed)
[facility]-EM[facility] DEPT Provider Note   CSN: [id number] Arrival date & time: 11/09/15  4098  First Provider Contact:  First MD Initiated Contact with Patient 11/09/15 1032        History   Chief Complaint Chief Complaint  Patient presents with  . Panic Attack    HPI Margaret Jones is a 20 y.o. female.  The history is provided by the patient and medical records. No language interpreter was used.   Margaret Jones is a 20 y.o. female  who presents to the Emergency Department complaining of "a panic attack." this morning. Patient states last night she was thinking about her "situation and I am going to do with my life" and felt very "overwhelmed". She decided to go to sleep and see if she felt better. Upon awakening, she believed her symptoms were improved. She was doing laundry and again thinking about what is going on in her life, when she again began to feel overwhelmed. She states both of her hands began to shake, and then she felt her as if her heart was racing. She also endorses dizziness described as an unsteady feeling. The symptoms lasted about an hour. No alleviating or aggravating factors noted, however symptoms resolved just prior to arrival. At present, patient is asymptomatic. No medications taken prior to arrival. Patient does endorse 2 similar episodes in the last year. She was told they were likely panic attacks. She has not followed up with a physician and never has taken medication for symptoms. She denies difficulty breathing or chest pain. No nausea or vomiting. No history of sudden cardiac death. No personal cardiac history. Not on OCP's.    Past Medical History:  Diagnosis Date  . Gall stones     Patient Active Problem List   Diagnosis Date Noted  . Abnormal LFTs 09/18/2015  . Abdominal pain 09/18/2015    Past Surgical History:  Procedure Laterality Date  . CESAREAN SECTION    . CHOLECYSTECTOMY    . ERCP N/A 09/21/2015   Procedure: ENDOSCOPIC RETROGRADE  CHOLANGIOPANCREATOGRAPHY (ERCP);  Surgeon: Vida Rigger, MD;  Location: [facility];  Service: Endoscopy;  Laterality: N/A;    OB History    No data available       Home Medications    Prior to Admission medications   Medication Sig Start Date End Date Taking? Authorizing Provider  acetaminophen (TYLENOL) 500 MG tablet Take 1,000 mg by mouth every 6 (six) hours as needed for mild pain, moderate pain, fever or headache.   Yes Historical Provider, MD  hydrocortisone cream 1 % Apply to affected area Patient not taking: Reported on 11/09/2015 10/29/15   Lona Kettle, PA-C  loratadine (CLARITIN) 10 MG tablet Take 1 tablet (10 mg total) by mouth daily. Patient not taking: Reported on 11/09/2015 10/29/15   Lona Kettle, PA-C  ranitidine (ZANTAC) 150 MG tablet Take 1 tablet (150 mg total) by mouth 2 (two) times daily. Patient not taking: Reported on 10/29/2015 09/18/15   Lorre Nick, MD  sucralfate (CARAFATE) 1 g tablet Take 1 tablet (1 g total) by mouth 4 (four) times daily. Patient not taking: Reported on 11/09/2015 09/18/15   Lorre Nick, MD    Family History No family history on file.  Social History Social History  Substance Use Topics  . Smoking status: Never Smoker  . Smokeless tobacco: Never Used  . Alcohol use No     Allergies   Rocephin [ceftriaxone sodium in dextrose]   Review of Systems Review of Systems  Constitutional: Negative for chills and fever.  Eyes: Negative for visual disturbance.  Respiratory: Negative for cough and shortness of breath.   Cardiovascular: Positive for palpitations. Negative for chest pain and leg swelling.  Gastrointestinal: Negative for abdominal pain, nausea and vomiting.  Genitourinary: Negative for dysuria.  Musculoskeletal: Negative for back pain and neck pain.  Skin: Negative for rash.  Neurological: Positive for tremors. Negative for seizures, syncope and headaches.  Psychiatric/Behavioral: The patient is  nervous/anxious.      Physical Exam Updated Vital Signs BP 118/71 (BP Location: Left Arm)   Pulse 92   Temp 98.3 F (36.8 C) (Oral)   LMP 10/22/2015 (Exact Date)   SpO2 100%   Physical Exam  Constitutional: She is oriented to person, place, and time. She appears well-developed and well-nourished. No distress.  HENT:  Head: Normocephalic and atraumatic.  Cardiovascular: Normal rate, regular rhythm, normal heart sounds and intact distal pulses.  Exam reveals no gallop and no friction rub.   No murmur heard. Pulmonary/Chest: Effort normal and breath sounds normal. No respiratory distress. She has no wheezes. She has no rales. She exhibits no tenderness.  Speaking in full sentences.  Abdominal: Soft. Bowel sounds are normal. She exhibits no distension. There is no tenderness.  Musculoskeletal: She exhibits no edema.  Neurological: She is alert and oriented to person, place, and time.  Skin: Skin is warm and dry.  Psychiatric:  Normal mood and affect. Does not appear anxious.   Nursing note and vitals reviewed.    ED Treatments / Results  Labs (all labs ordered are listed, but only abnormal results are displayed) Labs Reviewed  PREGNANCY, URINE    EKG  EKG Interpretation  Date/Time:  Thursday November 09 2015 10:00:08 EDT Ventricular Rate:  86 PR Interval:    QRS Duration: 83 QT Interval:  349 QTC Calculation: 418 R Axis:   77 Text Interpretation:  Sinus rhythm Confirmed by Lincoln Brighamees, Liz [phone]([id number]) on 11/09/2015 11:52:53 AM       Radiology No results found.  Procedures Procedures (including critical care time)  Medications Ordered in ED Medications - No data to display   Initial Impression / Assessment and Plan / ED Course  I have reviewed the triage vital signs and the nursing notes.  Pertinent labs & imaging results that were available during my care of the patient were reviewed by me and considered in my medical decision making (see chart for  details).  Clinical Course   Margaret Jones presents to ED for feeling overwhelmed, leading to palpitations and bilateral tremors. Symptoms lasted approximately an hour, but have resolved prior to my evaluation. She has a normal heart and lung exam. She appears well and is not anxious. She Is not complaining of any symptoms at this time. Upreg obtained which was unremarkable. EKG NSR.   Evaluation does not show pathology that would require ongoing emergent intervention or inpatient treatment. Patient is hemodynamically stable and mentating appropriately. Discussed findings and plan with patient. PCP follow up strongly encourage. Return precautions discussed and all questions answered.   Final Clinical Impressions(s) / ED Diagnoses   Final diagnoses:  Panic attack    New Prescriptions New Prescriptions   No medications on file     [facility] LPJaime Pilcher Ward, PA-C 11/09/15 1209    Tilden FossaElizabeth Rees, MD 11/10/15 [phone]

## 2015-12-19 ENCOUNTER — Encounter (HOSPITAL_COMMUNITY): Payer: Self-pay | Admitting: *Deleted

## 2015-12-19 ENCOUNTER — Inpatient Hospital Stay (HOSPITAL_COMMUNITY)
Admission: AD | Admit: 2015-12-19 | Discharge: 2015-12-19 | Disposition: A | Discharge status: Home / Self Care | Payer: Medicaid Other | Source: Ambulatory Visit | Attending: Obstetrics and Gynecology | Admitting: Obstetrics and Gynecology

## 2015-12-19 DIAGNOSIS — Z3A01 Less than 8 weeks gestation of pregnancy: Secondary | ICD-10-CM | POA: Insufficient documentation

## 2015-12-19 DIAGNOSIS — R42 Dizziness and giddiness: Secondary | ICD-10-CM | POA: Diagnosis not present

## 2015-12-19 DIAGNOSIS — Z3201 Encounter for pregnancy test, result positive: Secondary | ICD-10-CM | POA: Insufficient documentation

## 2015-12-19 DIAGNOSIS — O210 Mild hyperemesis gravidarum: Secondary | ICD-10-CM | POA: Insufficient documentation

## 2015-12-19 DIAGNOSIS — R110 Nausea: Secondary | ICD-10-CM | POA: Insufficient documentation

## 2015-12-19 LAB — COMPREHENSIVE METABOLIC PANEL
ALBUMIN: 4.5 g/dL (ref 3.5–5.0)
ALT: 52 U/L (ref 14–54)
AST: 31 U/L (ref 15–41)
Alkaline Phosphatase: 54 U/L (ref 38–126)
Anion gap: 7 (ref 5–15)
BUN: 12 mg/dL (ref 6–20)
CHLORIDE: 106 mmol/L (ref 101–111)
CO2: 22 mmol/L (ref 22–32)
CREATININE: 0.78 mg/dL (ref 0.44–1.00)
Calcium: 9.5 mg/dL (ref 8.9–10.3)
GLUCOSE: 92 mg/dL (ref 65–99)
POTASSIUM: 3.6 mmol/L (ref 3.5–5.1)
SODIUM: 135 mmol/L (ref 135–145)
Total Bilirubin: 0.6 mg/dL (ref 0.3–1.2)
Total Protein: 8 g/dL (ref 6.5–8.1)

## 2015-12-19 LAB — URINALYSIS, ROUTINE W REFLEX MICROSCOPIC
Bilirubin Urine: NEGATIVE
GLUCOSE, UA: NEGATIVE mg/dL
Hgb urine dipstick: NEGATIVE
Ketones, ur: NEGATIVE mg/dL
LEUKOCYTES UA: NEGATIVE
NITRITE: NEGATIVE
PH: 5.5 (ref 5.0–8.0)
PROTEIN: NEGATIVE mg/dL
Specific Gravity, Urine: >1.03 — ABNORMAL HIGH (ref 1.005–1.030)

## 2015-12-19 LAB — CBC
HCT: 34.4 % — ABNORMAL LOW (ref 36.0–46.0)
HEMOGLOBIN: 11.3 g/dL — AB (ref 12.0–15.0)
MCH: 26 pg (ref 26.0–34.0)
MCHC: 32.8 g/dL (ref 30.0–36.0)
MCV: 79.1 fL (ref 78.0–100.0)
PLATELETS: 335 10*3/uL (ref 150–400)
RBC: 4.35 MIL/uL (ref 3.87–5.11)
RDW: 14.7 % (ref 11.5–15.5)
WBC: 8.4 10*3/uL (ref 4.0–10.5)

## 2015-12-19 LAB — POCT PREGNANCY, URINE: Preg Test, Ur: POSITIVE — AB

## 2015-12-19 MED ORDER — CONCEPT OB 130-92.4-1 MG PO CAPS: 1.0000 | ORAL_CAPSULE | Freq: Every day | ORAL | 12 refills | Status: DC

## 2015-12-19 NOTE — Discharge Instructions (Signed)
Dizziness °Dizziness is a common problem. It is a feeling of unsteadiness or light-headedness. You may feel like you are about to faint. Dizziness can lead to injury if you stumble or fall. Anyone can become dizzy, but dizziness is more common in older adults. This condition can be caused by a number of things, including medicines, dehydration, or illness. °HOME CARE INSTRUCTIONS °Taking these steps may help with your condition: °Eating and Drinking °· Drink enough fluid to keep your urine clear or pale yellow. This helps to keep you from becoming dehydrated. Try to drink more clear fluids, such as water. °· Do not drink alcohol. °· Limit your caffeine intake if directed by your health care provider. °· Limit your salt intake if directed by your health care provider. °Activity °· Avoid making quick movements. °· Rise slowly from chairs and steady yourself until you feel okay. °· In the morning, first sit up on the side of the bed. When you feel okay, stand slowly while you hold onto something until you know that your balance is fine. °· Move your legs often if you need to stand in one place for a long time. Tighten and relax your muscles in your legs while you are standing. °· Do not drive or operate heavy machinery if you feel dizzy. °· Avoid bending down if you feel dizzy. Place items in your home so that they are easy for you to reach without leaning over. °Lifestyle °· Do not use any tobacco products, including cigarettes, chewing tobacco, or electronic cigarettes. If you need help quitting, ask your health care provider. °· Try to reduce your stress level, such as with yoga or meditation. Talk with your health care provider if you need help. °General Instructions °· Watch your dizziness for any changes. °· Take medicines only as directed by your health care provider. Talk with your health care provider if you think that your dizziness is caused by a medicine that you are taking. °· Tell a friend or a family  member that you are feeling dizzy. If he or she notices any changes in your behavior, have this person call your health care provider. °· Keep all follow-up visits as directed by your health care provider. This is important. °SEEK MEDICAL CARE IF: °· Your dizziness does not go away. °· Your dizziness or light-headedness gets worse. °· You feel nauseous. °· You have reduced hearing. °· You have new symptoms. °· You are unsteady on your feet or you feel like the room is spinning. °SEEK IMMEDIATE MEDICAL CARE IF: °· You vomit or have diarrhea and are unable to eat or drink anything. °· You have problems talking, walking, swallowing, or using your arms, hands, or legs. °· You feel generally weak. °· You are not thinking clearly or you have trouble forming sentences. It may take a friend or family member to notice this. °· You have chest pain, abdominal pain, shortness of breath, or sweating. °· Your vision changes. °· You notice any bleeding. °· You have a headache. °· You have neck pain or a stiff neck. °· You have a fever. °  °This information is not intended to replace advice given to you by your health care provider. Make sure you discuss any questions you have with your health care provider. °  °Document Released: 09/11/2000 Document Revised: 08/02/2014 Document Reviewed: 03/14/2014 °Elsevier Interactive Patient Education ©2016 Elsevier Inc. ° °Dehydration, Adult °Dehydration is a condition in which you do not have enough fluid or water in your   your body. It happens when you take in less fluid than you lose. Vital organs such as the kidneys, brain, and heart cannot function without a proper amount of fluids. Any loss of fluids from the body can cause dehydration.  Dehydration can range from mild to severe. This condition should be treated right away to help prevent it from becoming severe. CAUSES  This condition may be caused by:  Vomiting.  Diarrhea.  Excessive sweating, such as when exercising in hot or  humid weather.  Not drinking enough fluid during strenuous exercise or during an illness.  Excessive urine output.  Fever.  Certain medicines. RISK FACTORS This condition is more likely to develop in:  People who are taking certain medicines that cause the body to lose excess fluid (diuretics).   People who have a chronic illness, such as diabetes, that may increase urination.  Older adults.   People who live at high altitudes.   People who participate in endurance sports.  SYMPTOMS  Mild Dehydration  Thirst.  Dry lips.  Slightly dry mouth.  Dry, warm skin. Moderate Dehydration  Very dry mouth.   Muscle cramps.   Dark urine and decreased urine production.   Decreased tear production.   Headache.   Light-headedness, especially when you stand up from a sitting position.  Severe Dehydration  Changes in skin.   Cold and clammy skin.   Skin does not spring back quickly when lightly pinched and released.   Changes in body fluids.   Extreme thirst.   No tears.   Not able to sweat when body temperature is high, such as in hot weather.   Minimal urine production.   Changes in vital signs.   Rapid, weak pulse (more than 100 beats per minute when you are sitting still).   Rapid breathing.   Low blood pressure.   Other changes.   Sunken eyes.   Cold hands and feet.   Confusion.  Lethargy and difficulty being awakened.  Fainting (syncope).   Short-term weight loss.   Unconsciousness. DIAGNOSIS  This condition may be diagnosed based on your symptoms. You may also have tests to determine how severe your dehydration is. These tests may include:   Urine tests.   Blood tests.  TREATMENT  Treatment for this condition depends on the severity. Mild or moderate dehydration can often be treated at home. Treatment should be started right away. Do not wait until dehydration becomes severe. Severe dehydration needs to be  treated at the hospital. Treatment for Mild Dehydration  Drinking plenty of water to replace the fluid you have lost.   Replacing minerals in your blood (electrolytes) that you may have lost.  Treatment for Moderate Dehydration  Consuming oral rehydration solution (ORS). Treatment for Severe Dehydration  Receiving fluid through an IV tube.   Receiving electrolyte solution through a feeding tube that is passed through your nose and into your stomach (nasogastric tube or NG tube).  Correcting any abnormalities in electrolytes. HOME CARE INSTRUCTIONS   Drink enough fluid to keep your urine clear or pale yellow.   Drink water or fluid slowly by taking small sips. You can also try sucking on ice cubes.  Have food or beverages that contain electrolytes. Examples include bananas and sports drinks.  Take over-the-counter and prescription medicines only as told by your health care provider.   Prepare ORS according to the manufacturer's instructions. Take sips of ORS every 5 minutes until your urine returns to normal.  If you have vomiting  diarrhea, continue to try to drink water, ORS, or both.   °· If you have diarrhea, avoid:   °¨ Beverages that contain caffeine.   °¨ Fruit juice.   °¨ Milk.    °¨ Carbonated soft drinks. °· Do not take salt tablets. This can lead to the condition of having too much sodium in your body (hypernatremia).   °SEEK MEDICAL CARE IF: °· You cannot eat or drink without vomiting. °· You have had moderate diarrhea during a period of more than 24 hours. °· You have a fever. °SEEK IMMEDIATE MEDICAL CARE IF:  °· You have extreme thirst. °· You have severe diarrhea. °· You have not urinated in 6-8 hours, or you have urinated only a small amount of very dark urine. °· You have shriveled skin. °· You are dizzy, confused, or both. °  °This information is not intended to replace advice given to you by your health care provider. Make sure you discuss any questions you have with  your health care provider. °  °Document Released: 03/18/2005 Document Revised: 12/07/2014 Document Reviewed: 08/03/2014 °Elsevier Interactive Patient Education ©2016 Elsevier Inc. ° °

## 2015-12-19 NOTE — MAU Note (Addendum)
Pt C/O extreme dizziness all day today, nausea.  Denies pain, nausea or diarrhea. Pt has had one slice of pizza today, no other food, states she has been drinking well.

## 2015-12-19 NOTE — MAU Note (Signed)
Pt not in waiting area 

## 2015-12-22 NOTE — MAU Provider Note (Signed)
Chief Complaint: Nausea and Dizziness   First Provider Initiated Contact with Patient 12/19/15 2030     SUBJECTIVE HPI: Margaret Jones is a 20 y.o. [id number] female who presents to [facility] reporting dizziness and nausea today. LMP 4.3 weeks ago. She is late for her period, but has not taken a pregnancy test. Didn't eat much today.  Associated signs and symptoms: Neg for fever, chills, V/D/C, chest pain, SOB, palpitations.   Past Medical History:  Diagnosis Date  . Gall stones    OB History  Gravida Para Term Preterm AB Living  2 1 1         SAB TAB Ectopic Multiple Live Births               # Outcome Date GA Lbr Len/2nd Weight Sex Delivery Anes PTL Lv  2 Current           1 Term              Past Surgical History:  Procedure Laterality Date  . CESAREAN SECTION    . CHOLECYSTECTOMY    . ERCP N/A 09/21/2015   Procedure: ENDOSCOPIC RETROGRADE CHOLANGIOPANCREATOGRAPHY (ERCP);  Surgeon: Vida RiggerMarc Magod, MD;  Location: [facility];  Service: Endoscopy;  Laterality: N/A;   Social History   Social History  . Marital status: Single    Spouse name: N/A  . Number of children: N/A  . Years of education: N/A   Occupational History  . Not on file.   Social History Main Topics  . Smoking status: Never Smoker  . Smokeless tobacco: Never Used  . Alcohol use No  . Drug use: No  . Sexual activity: Yes    Birth control/ protection: None   Other Topics Concern  . Not on file   Social History Narrative  . No narrative on file   No current facility-administered medications on file prior to encounter.    Current Outpatient Prescriptions on File Prior to Encounter  Medication Sig Dispense Refill  . acetaminophen (TYLENOL) 500 MG tablet Take 1,000 mg by mouth every 6 (six) hours as needed for mild pain, moderate pain, fever or headache.     Allergies  Allergen Reactions  . Rocephin [Ceftriaxone Sodium In Dextrose] Anaphylaxis    Given Epi 0.3mg     I have reviewed the past  Medical Hx, Surgical Hx, Social Hx, Allergies and Medications.   Review of Systems  Constitutional: Negative for appetite change, chills and fever.  Respiratory: Negative for chest tightness and shortness of breath.   Cardiovascular: Negative for chest pain.  Gastrointestinal: Positive for nausea. Negative for abdominal pain, diarrhea and vomiting.  Genitourinary: Negative for dysuria and vaginal bleeding.  Neurological: Positive for dizziness. Negative for syncope.    OBJECTIVE No data found. BP 116-65 HR: 90 RR: 16 Temp 98.3 Orthostatic VS: Lying: BP 129/69 HR 77 Sitting: BP 122/69 HR 80 Standing: BP 129/75 HR 98 Standing 3 Min: BP: 125/80  HR 108    Constitutional: Well-developed, well-nourished female in no acute distress.  Skin: no pallor Skin: No pallor Cardiovascular: normal rate Respiratory: normal rate and effort.  GI: Abd soft, non-tender, gravid appropriate for gestational age. Pos BS x 4 Neurologic: Alert and oriented x 4.  GU: Deferred  LAB RESULTS Results for orders placed or performed during the hospital encounter of 12/19/15 (from the past 168 hour(s))  Urinalysis, Routine w reflex microscopic (not at [facility])   Collection Time: 12/19/15  6:20 PM  Result Value Ref Range  Color, Urine YELLOW YELLOW   APPearance CLEAR CLEAR   Specific Gravity, Urine >1.030 (H) 1.005 - 1.030   pH 5.5 5.0 - 8.0   Glucose, UA NEGATIVE NEGATIVE mg/dL   Hgb urine dipstick NEGATIVE NEGATIVE   Bilirubin Urine NEGATIVE NEGATIVE   Ketones, ur NEGATIVE NEGATIVE mg/dL   Protein, ur NEGATIVE NEGATIVE mg/dL   Nitrite NEGATIVE NEGATIVE   Leukocytes, UA NEGATIVE NEGATIVE  Pregnancy, urine POC   Collection Time: 12/19/15  6:42 PM  Result Value Ref Range   Preg Test, Ur POSITIVE (A) NEGATIVE  CBC   Collection Time: 12/19/15  8:28 PM  Result Value Ref Range   WBC 8.4 4.0 - 10.5 K/uL   RBC 4.35 3.87 - 5.11 MIL/uL   Hemoglobin 11.3 (L) 12.0 - 15.0 g/dL   HCT 16.1 (L) 09.6 - 04.5 %    MCV 79.1 78.0 - 100.0 fL   MCH 26.0 26.0 - 34.0 pg   MCHC 32.8 30.0 - 36.0 g/dL   RDW 40.9 81.1 - 91.4 %   Platelets 335 150 - 400 K/uL  Comprehensive metabolic panel   Collection Time: 12/19/15  8:28 PM  Result Value Ref Range   Sodium 135 135 - 145 mmol/L   Potassium 3.6 3.5 - 5.1 mmol/L   Chloride 106 101 - 111 mmol/L   CO2 22 22 - 32 mmol/L   Glucose, Bld 92 65 - 99 mg/dL   BUN 12 6 - 20 mg/dL   Creatinine, Ser 7.82 0.44 - 1.00 mg/dL   Calcium 9.5 8.9 - 95.6 mg/dL   Total Protein 8.0 6.5 - 8.1 g/dL   Albumin 4.5 3.5 - 5.0 g/dL   AST 31 15 - 41 U/L   ALT 52 14 - 54 U/L   Alkaline Phosphatase 54 38 - 126 U/L   Total Bilirubin 0.6 0.3 - 1.2 mg/dL   Anion gap 7.0 5 - 15     IMAGING No results found.  MAU COURSE Orders Placed This Encounter  Procedures  . Urinalysis, Routine w reflex microscopic (not at [facility])  . CBC  . Comprehensive metabolic panel  . Pregnancy, urine POC  Push PO fluids  Dizziness resolved. Pt notified of Pos UPT. Very surprised. States Sx now make sense.   MDM - Dizziness, nausea 2/2 pregnancy. Resolved w/ PO fluids.   ASSESSMENT 1. Dizziness   2. Positive pregnancy test     PLAN Discharge home in stable condition. First trimester Precautions List of providers given.  Rx PNV Pregnancy verification letter given.  Follow-up Information    obstetrician of your choice .   Why:  Start prenatal care       [facility] [place] [facility] .   Why:  as needed in emergencies Contact information: [address] [id number] mc [place] [address] [phone]           Medication List    STOP taking these medications   hydrocortisone cream 1 %   loratadine 10 MG tablet Commonly known as:  CLARITIN   ranitidine 150 MG tablet Commonly known as:  ZANTAC   sucralfate 1 g tablet Commonly known as:  CARAFATE     TAKE these medications   acetaminophen 500 MG tablet Commonly known as:   TYLENOL Take 1,000 mg by mouth every 6 (six) hours as needed for mild pain, moderate pain, fever or headache.   CONCEPT OB 130-92.4-1 MG Caps Take 1 tablet by mouth daily.  [place], [place] 12/22/2015  3:47 PM

## 2016-01-12 ENCOUNTER — Inpatient Hospital Stay (HOSPITAL_COMMUNITY)
Admission: AD | Admit: 2016-01-12 | Discharge: 2016-01-12 | Disposition: A | Discharge status: Home / Self Care | Payer: Medicaid Other | Source: Ambulatory Visit | Attending: Obstetrics & Gynecology | Admitting: Obstetrics & Gynecology

## 2016-01-12 ENCOUNTER — Encounter (HOSPITAL_COMMUNITY): Payer: Self-pay

## 2016-01-12 ENCOUNTER — Inpatient Hospital Stay (HOSPITAL_COMMUNITY): Payer: Medicaid Other

## 2016-01-12 DIAGNOSIS — Z3A01 Less than 8 weeks gestation of pregnancy: Secondary | ICD-10-CM | POA: Insufficient documentation

## 2016-01-12 DIAGNOSIS — O210 Mild hyperemesis gravidarum: Secondary | ICD-10-CM | POA: Insufficient documentation

## 2016-01-12 DIAGNOSIS — O26891 Other specified pregnancy related conditions, first trimester: Secondary | ICD-10-CM | POA: Insufficient documentation

## 2016-01-12 DIAGNOSIS — O219 Vomiting of pregnancy, unspecified: Secondary | ICD-10-CM

## 2016-01-12 DIAGNOSIS — Z3491 Encounter for supervision of normal pregnancy, unspecified, first trimester: Secondary | ICD-10-CM

## 2016-01-12 DIAGNOSIS — O26899 Other specified pregnancy related conditions, unspecified trimester: Secondary | ICD-10-CM

## 2016-01-12 DIAGNOSIS — R109 Unspecified abdominal pain: Secondary | ICD-10-CM | POA: Diagnosis not present

## 2016-01-12 DIAGNOSIS — R12 Heartburn: Secondary | ICD-10-CM | POA: Diagnosis not present

## 2016-01-12 HISTORY — DX: Anemia, unspecified: D64.9

## 2016-01-12 LAB — CBC
HEMATOCRIT: 33.7 % — AB (ref 36.0–46.0)
HEMOGLOBIN: 11 g/dL — AB (ref 12.0–15.0)
MCH: 25.8 pg — ABNORMAL LOW (ref 26.0–34.0)
MCHC: 32.6 g/dL (ref 30.0–36.0)
MCV: 79.1 fL (ref 78.0–100.0)
Platelets: 394 10*3/uL (ref 150–400)
RBC: 4.26 MIL/uL (ref 3.87–5.11)
RDW: 14.8 % (ref 11.5–15.5)
WBC: 9.1 10*3/uL (ref 4.0–10.5)

## 2016-01-12 LAB — COMPREHENSIVE METABOLIC PANEL
ALT: 27 U/L (ref 14–54)
ANION GAP: 7 (ref 5–15)
AST: 19 U/L (ref 15–41)
Albumin: 4.1 g/dL (ref 3.5–5.0)
Alkaline Phosphatase: 54 U/L (ref 38–126)
BUN: 8 mg/dL (ref 6–20)
CHLORIDE: 103 mmol/L (ref 101–111)
CO2: 24 mmol/L (ref 22–32)
CREATININE: 0.65 mg/dL (ref 0.44–1.00)
Calcium: 9.6 mg/dL (ref 8.9–10.3)
Glucose, Bld: 93 mg/dL (ref 65–99)
Potassium: 4 mmol/L (ref 3.5–5.1)
SODIUM: 134 mmol/L — AB (ref 135–145)
Total Bilirubin: 0.6 mg/dL (ref 0.3–1.2)
Total Protein: 7.6 g/dL (ref 6.5–8.1)

## 2016-01-12 LAB — URINALYSIS, ROUTINE W REFLEX MICROSCOPIC
Bilirubin Urine: NEGATIVE
GLUCOSE, UA: NEGATIVE mg/dL
Hgb urine dipstick: NEGATIVE
Ketones, ur: NEGATIVE mg/dL
Nitrite: NEGATIVE
PROTEIN: NEGATIVE mg/dL
Specific Gravity, Urine: 1.025 (ref 1.005–1.030)
pH: 6 (ref 5.0–8.0)

## 2016-01-12 LAB — URINE MICROSCOPIC-ADD ON

## 2016-01-12 LAB — HCG, QUANTITATIVE, PREGNANCY: hCG, Beta Chain, Quant, S: 81192 m[IU]/mL — ABNORMAL HIGH (ref <5)

## 2016-01-12 LAB — ABO/RH: ABO/RH(D): B POS

## 2016-01-12 MED ORDER — PROMETHAZINE HCL 25 MG PO TABS
25.0000 mg | ORAL_TABLET | Freq: Once | ORAL | Status: AC
  Administered 2016-01-12: 25 mg via ORAL
  Filled 2016-01-12: qty 1

## 2016-01-12 MED ORDER — RANITIDINE HCL 150 MG PO TABS: 150.0000 mg | ORAL_TABLET | Freq: Two times a day (BID) | ORAL | 0 refills | Status: DC

## 2016-01-12 MED ORDER — PROMETHAZINE HCL 25 MG PO TABS: 25.0000 mg | ORAL_TABLET | Freq: Four times a day (QID) | ORAL | 0 refills | Status: DC | PRN

## 2016-01-12 MED ORDER — ACETAMINOPHEN 500 MG PO TABS
1000.0000 mg | ORAL_TABLET | Freq: Once | ORAL | Status: AC
  Administered 2016-01-12: 1000 mg via ORAL
  Filled 2016-01-12: qty 2

## 2016-01-12 NOTE — Discharge Instructions (Signed)
Abdominal Pain During Pregnancy °Abdominal pain is common in pregnancy. Most of the time, it does not cause harm. There are many causes of abdominal pain. Some causes are more serious than others. Some of the causes of abdominal pain in pregnancy are easily diagnosed. Occasionally, the diagnosis takes time to understand. Other times, the cause is not determined. Abdominal pain can be a sign that something is very wrong with the pregnancy, or the pain may have nothing to do with the pregnancy at all. For this reason, always tell your health care provider if you have any abdominal discomfort. °HOME CARE INSTRUCTIONS  °Monitor your abdominal pain for any changes. The following actions may help to alleviate any discomfort you are experiencing: °· Do not have sexual intercourse or put anything in your vagina until your symptoms go away completely. °· Get plenty of rest until your pain improves. °· Drink clear fluids if you feel nauseous. Avoid solid food as long as you are uncomfortable or nauseous. °· Only take over-the-counter or prescription medicine as directed by your health care provider. °· Keep all follow-up appointments with your health care provider. °SEEK IMMEDIATE MEDICAL CARE IF: °· You are bleeding, leaking fluid, or passing tissue from the vagina. °· You have increasing pain or cramping. °· You have persistent vomiting. °· You have painful or bloody urination. °· You have a fever. °· You notice a decrease in your baby's movements. °· You have extreme weakness or feel faint. °· You have shortness of breath, with or without abdominal pain. °· You develop a severe headache with abdominal pain. °· You have abnormal vaginal discharge with abdominal pain. °· You have persistent diarrhea. °· You have abdominal pain that continues even after rest, or gets worse. °MAKE SURE YOU:  °· Understand these instructions. °· Will watch your condition. °· Will get help right away if you are not doing well or get worse. °    °This information is not intended to replace advice given to you by your health care provider. Make sure you discuss any questions you have with your health care provider. °  °Document Released: 03/18/2005 Document Revised: 01/06/2013 Document Reviewed: 10/15/2012 °Elsevier Interactive Patient Education ©2016 Elsevier Inc. °Heartburn During Pregnancy °Heartburn is a burning sensation in the chest caused by stomach acid backing up into the esophagus. Heartburn is common in pregnancy because a certain hormone (progesterone) is released when a woman is pregnant. The progesterone hormone may relax the valve that separates the esophagus from the stomach. This allows acid to go up into the esophagus, causing heartburn. Heartburn may also happen in pregnancy because the enlarging uterus pushes up on the stomach, which pushes more acid into the esophagus. This is especially true in the later stages of pregnancy. Heartburn problems usually go away after giving birth. °CAUSES  °Heartburn is caused by stomach acid backing up into the esophagus. During pregnancy, this may result from various things, including:  °· The progesterone hormone. °· Changing hormone levels. °· The growing uterus pushing stomach acid upward. °· Large meals. °· Certain foods and drinks. °· Exercise. °· Increased acid production. °SIGNS AND SYMPTOMS  °· Burning pain in the chest or lower throat. °· Bitter taste in the mouth. °· Coughing. °DIAGNOSIS  °Your health care provider will typically diagnose heartburn by taking a careful history of your concern. Blood tests may be done to check for a certain type of bacteria that is associated with heartburn. Sometimes, heartburn is diagnosed by prescribing a heartburn medicine to   to see if the symptoms improve. In some cases, a procedure called an endoscopy may be done. In this procedure, a tube with a light and a camera on the end (endoscope) is used to examine the esophagus and the stomach. TREATMENT   Treatment will vary depending on the severity of your symptoms. Your health care provider may recommend:  Over-the-counter medicines (antacids, acid reducers) for mild heartburn.  Prescription medicines to decrease stomach acid or to protect your stomach lining.  Certain changes in your diet.  Elevating the head of your bed by putting blocks under the legs. This helps prevent stomach acid from backing up into the esophagus when you are lying down. HOME CARE INSTRUCTIONS   Only take over-the-counter or prescription medicines as directed by your health care provider.  Raise the head of your bed by putting blocks under the legs if instructed to do so by your health care provider. Sleeping with more pillows is not effective because it only changes the position of your head.  Do not exercise right after eating.  Avoid eating 2-3 hours before bed. Do not lie down right after eating.  Eat small meals throughout the day instead of three large meals.  Identify foods and beverages that make your symptoms worse and avoid them. Foods you may want to avoid include:  Peppers.  Chocolate.  High-fat foods, including fried foods.  Spicy foods.  Garlic and onions.  Citrus fruits, including oranges, grapefruit, lemons, and limes.  Food containing tomatoes or tomato products.  Mint.  Carbonated and caffeinated drinks.  Vinegar. SEEK MEDICAL CARE IF:  You have abdominal pain of any kind.  You feel burning in your upper abdomen or chest, especially after eating or lying down.  You have nausea and vomiting.  Your stomach feels upset after you eat. SEEK IMMEDIATE MEDICAL CARE IF:   You have severe chest pain that goes down your arm or into your jaw or neck.  You feel sweaty, dizzy, or light-headed.  You become short of breath.  You vomit blood.  You have difficulty or pain with swallowing.  You have bloody or black, tarry stools.  You have episodes of heartburn more than 3  times a week, for more than 2 weeks. MAKE SURE YOU:  Understand these instructions.  Will watch your condition.  Will get help right away if you are not doing well or get worse.   This information is not intended to replace advice given to you by your health care provider. Make sure you discuss any questions you have with your health care provider.   Document Released: 03/15/2000 Document Revised: 04/08/2014 Document Reviewed: 11/04/2012 Elsevier Interactive Patient Education 2016 Elsevier Inc.  Morning Sickness Morning sickness is when you feel sick to your stomach (nauseous) during pregnancy. You may feel sick to your stomach and throw up (vomit). You may feel sick in the morning, but you can feel this way any time of day. Some women feel very sick to their stomach and cannot stop throwing up (hyperemesis gravidarum). HOME CARE  Only take medicines as told by your doctor.  Take multivitamins as told by your doctor. Taking multivitamins before getting pregnant can stop or lessen the harshness of morning sickness.  Eat dry toast or unsalted crackers before getting out of bed.  Eat 5 to 6 small meals a day.  Eat dry and bland foods like rice and baked potatoes.  Do not drink liquids with meals. Drink between meals.  Do not eat greasy,  fatty, or spicy foods.  Have someone cook for you if the smell of food causes you to feel sick or throw up.  If you feel sick to your stomach after taking prenatal vitamins, take them at night or with a snack.  Eat protein when you need a snack (nuts, yogurt, cheese).  Eat unsweetened gelatins for dessert.  Wear a bracelet used for sea sickness (acupressure wristband).  Go to a doctor that puts thin needles into certain body points (acupuncture) to improve how you feel.  Do not smoke.  Use a humidifier to keep the air in your house free of odors.  Get lots of fresh air. GET HELP IF:  You need medicine to feel better.  You feel dizzy  or lightheaded.  You are losing weight. GET HELP RIGHT AWAY IF:   You feel very sick to your stomach and cannot stop throwing up.  You pass out (faint). MAKE SURE YOU:  Understand these instructions.  Will watch your condition.  Will get help right away if you are not doing well or get worse.   This information is not intended to replace advice given to you by your health care provider. Make sure you discuss any questions you have with your health care provider.   Document Released: 04/25/2004 Document Revised: 04/08/2014 Document Reviewed: 09/02/2012 Elsevier Interactive Patient Education [employer] Inc.

## 2016-01-12 NOTE — MAU Provider Note (Signed)
History     CSN: [id number]  Arrival date and time: 01/12/16 5284   First Provider Initiated Contact with Patient 01/12/16 1952      Chief Complaint  Patient presents with  . Abdominal Pain   Margaret Jones is a 20 y.o. G2P1001 at [redacted]w[redacted]d by LMP who presents with abdominal pain & n/v.    Abdominal Pain  This is a new problem. Episode onset: x2 days. The problem occurs constantly. The problem has been unchanged. The pain is located in the periumbilical region (lower to mid abdomen). The pain is at a severity of 8/10. The quality of the pain is cramping and sharp. The abdominal pain radiates to the back (low back). Associated symptoms include headaches, nausea and vomiting. Pertinent negatives include no anorexia, constipation, diarrhea, dysuria, fever, frequency or melena. The pain is aggravated by eating. The pain is relieved by nothing. She has tried nothing for the symptoms. Her past medical history is significant for gallstones (gall blader removed).    OB History    Gravida Para Term Preterm AB Living   2 1 1     1    SAB TAB Ectopic Multiple Live Births           1      Past Medical History:  Diagnosis Date  . Anemia   . Gall stones     Past Surgical History:  Procedure Laterality Date  . CESAREAN SECTION    . CHOLECYSTECTOMY    . ERCP N/A 09/21/2015   Procedure: ENDOSCOPIC RETROGRADE CHOLANGIOPANCREATOGRAPHY (ERCP);  Surgeon: Vida Rigger, MD;  Location: [facility];  Service: Endoscopy;  Laterality: N/A;    Family History  Problem Relation Age of Onset  . Asthma Mother   . Heart disease Mother   . Diabetes Father   . Hypertension Father     Social History  Substance Use Topics  . Smoking status: Never Smoker  . Smokeless tobacco: Never Used  . Alcohol use No    Allergies:  Allergies  Allergen Reactions  . Rocephin [Ceftriaxone] Anaphylaxis    Prescriptions Prior to Admission  Medication Sig Dispense Refill Last Dose  . acetaminophen (TYLENOL) 500 MG  tablet Take 1,000 mg by mouth every 6 (six) hours as needed for mild pain, moderate pain, fever or headache.   Past Week at Unknown time  . Prenat w/o A Vit-FeFum-FePo-FA (CONCEPT OB) 130-92.4-1 MG CAPS Take 1 tablet by mouth daily. (Patient not taking: Reported on 01/12/2016) 30 capsule 12 Not Taking at Unknown time    Review of Systems  Constitutional: Negative for chills and fever.  Gastrointestinal: Positive for abdominal pain, nausea and vomiting. Negative for anorexia, constipation, diarrhea and melena.  Genitourinary: Negative for dysuria and frequency.       No vaginal bleeding or vaginal discharge  Neurological: Positive for headaches.   Physical Exam   Blood pressure 112/79, pulse 94, temperature 98.1 F (36.7 C), temperature source Oral, resp. rate 16, weight 193 lb 6 oz (87.7 kg), last menstrual period 11/18/2015, SpO2 99 %.  Physical Exam  Nursing note and vitals reviewed. Constitutional: She is oriented to person, place, and time. She appears well-developed and well-nourished. No distress.  HENT:  Head: Normocephalic and atraumatic.  Eyes: Conjunctivae are normal. Right eye exhibits no discharge. Left eye exhibits no discharge. No scleral icterus.  Neck: Normal range of motion.  Cardiovascular: Normal rate, regular rhythm and normal heart sounds.   No murmur heard. Respiratory: Effort normal and breath sounds normal. No  respiratory distress. She has no wheezes.  GI: Soft. Bowel sounds are normal. She exhibits no distension. There is no tenderness. There is no rebound and no guarding.  Neurological: She is alert and oriented to person, place, and time.  Skin: Skin is warm and dry. She is not diaphoretic.  Psychiatric: She has a normal mood and affect. Her behavior is normal. Judgment and thought content normal.    MAU Course  Procedures Results for orders placed or performed during the hospital encounter of 01/12/16 (from the past 24 hour(s))  Urinalysis, Routine w  reflex microscopic (not at [facility])     Status: Abnormal   Collection Time: 01/12/16  7:25 PM  Result Value Ref Range   Color, Urine YELLOW YELLOW   APPearance CLEAR CLEAR   Specific Gravity, Urine 1.025 1.005 - 1.030   pH 6.0 5.0 - 8.0   Glucose, UA NEGATIVE NEGATIVE mg/dL   Hgb urine dipstick NEGATIVE NEGATIVE   Bilirubin Urine NEGATIVE NEGATIVE   Ketones, ur NEGATIVE NEGATIVE mg/dL   Protein, ur NEGATIVE NEGATIVE mg/dL   Nitrite NEGATIVE NEGATIVE   Leukocytes, UA TRACE (A) NEGATIVE  Urine microscopic-add on     Status: Abnormal   Collection Time: 01/12/16  7:25 PM  Result Value Ref Range   Squamous Epithelial / LPF 0-5 (A) NONE SEEN   WBC, UA 0-5 0 - 5 WBC/hpf   RBC / HPF 0-5 0 - 5 RBC/hpf   Bacteria, UA FEW (A) NONE SEEN  CBC     Status: Abnormal   Collection Time: 01/12/16  7:56 PM  Result Value Ref Range   WBC 9.1 4.0 - 10.5 K/uL   RBC 4.26 3.87 - 5.11 MIL/uL   Hemoglobin 11.0 (L) 12.0 - 15.0 g/dL   HCT 16.1 (L) 09.6 - 04.5 %   MCV 79.1 78.0 - 100.0 fL   MCH 25.8 (L) 26.0 - 34.0 pg   MCHC 32.6 30.0 - 36.0 g/dL   RDW 40.9 81.1 - 91.4 %   Platelets 394 150 - 400 K/uL  hCG, quantitative, pregnancy     Status: Abnormal   Collection Time: 01/12/16  7:56 PM  Result Value Ref Range   hCG, Beta Chain, Quant, S 81,192 (H) <5 mIU/mL  ABO/Rh     Status: None (Preliminary result)   Collection Time: 01/12/16  7:56 PM  Result Value Ref Range   ABO/RH(D) B POS   Comprehensive metabolic panel     Status: Abnormal   Collection Time: 01/12/16  7:56 PM  Result Value Ref Range   Sodium 134 (L) 135 - 145 mmol/L   Potassium 4.0 3.5 - 5.1 mmol/L   Chloride 103 101 - 111 mmol/L   CO2 24 22 - 32 mmol/L   Glucose, Bld 93 65 - 99 mg/dL   BUN 8 6 - 20 mg/dL   Creatinine, Ser 7.82 0.44 - 1.00 mg/dL   Calcium 9.6 8.9 - 95.6 mg/dL   Total Protein 7.6 6.5 - 8.1 g/dL   Albumin 4.1 3.5 - 5.0 g/dL   AST 19 15 - 41 U/L   ALT 27 14 - 54 U/L   Alkaline Phosphatase 54 38 - 126 U/L   Total  Bilirubin 0.6 0.3 - 1.2 mg/dL   GFR calc non Af Amer >60 >60 mL/min   GFR calc Af Amer >60 >60 mL/min   Anion gap 7 5 - 15   US Ob Comp Less 14 Wks  Result Date: 01/12/2016 CLINICAL DATA:  Pelvic pain  for 2 days EXAM: OBSTETRIC <14 WK US AND TRANSVAGINAL OB US TECHNIQUE: Both transabdominal and transvaginal ultrasound examinations were performed for complete evaluation of the gestation as well as the maternal uterus, adnexal regions, and pelvic cul-de-sac. Transvaginal technique was performed to assess early pregnancy. COMPARISON:  None. FINDINGS: Intrauterine gestational sac: A single IUP is identified. Yolk sac:  A yolk sac is noted. Embryo:  A single fetal pole is noted. Cardiac Activity: Present. Heart Rate: 166  bpm MSD:   mm    w     d CRL:  1.78 cm 8 w   1 d                  [facility] Subchorionic hemorrhage:  None visualized. Maternal uterus/adnexae: There is a right-sided corpus luteum cyst. The ovaries are otherwise unremarkable. Trace fluid in the pelvis is likely physiologic. IMPRESSION: Single live IUP.  No cause for pelvic pain identified. Electronically Signed   By: Gerome Samavid  Williams III M.D   On: 01/12/2016 20:44   [place] Ob Transvaginal  Result Date: 01/12/2016 CLINICAL DATA:  Pelvic pain for 2 days EXAM: OBSTETRIC <14 WK US AND TRANSVAGINAL OB US TECHNIQUE: Both transabdominal and transvaginal ultrasound examinations were performed for complete evaluation of the gestation as well as the maternal uterus, adnexal regions, and pelvic cul-de-sac. Transvaginal technique was performed to assess early pregnancy. COMPARISON:  None. FINDINGS: Intrauterine gestational sac: A single IUP is identified. Yolk sac:  A yolk sac is noted. Embryo:  A single fetal pole is noted. Cardiac Activity: Present. Heart Rate: 166  bpm MSD:   mm    w     d CRL:  1.78 cm 8 w   1 d                  [facility] Subchorionic hemorrhage:  None visualized. Maternal uterus/adnexae: There is a right-sided  corpus luteum cyst. The ovaries are otherwise unremarkable. Trace fluid in the pelvis is likely physiologic. IMPRESSION: Single live IUP.  No cause for pelvic pain identified. Electronically Signed   By: Gerome Samavid  Williams III M.D   On: 01/12/2016 20:44    MDM +UPT UA, CBC, ABO/Rh, quant hCG, HIV, and US today to rule out ectopic pregnancy Ultrasound shows SIUP with cardiac activity CBC & CMP normal No vomiting in MAU Phenergan 25 mg PO & tylenol 1 gm PO   Assessment and Plan  A: 1. Normal IUP (intrauterine pregnancy) on prenatal ultrasound, first trimester   2. Abdominal cramping affecting pregnancy   3. Nausea and vomiting during pregnancy prior to [redacted] weeks gestation   4. Heartburn during pregnancy in first trimester    P: Discharge home Rx phenergan & zantac Discussed reasons to return to MAU Start prenatal care  Judeth Hornrin Axel Meas 01/12/2016, 7:52 PM

## 2016-01-12 NOTE — MAU Note (Signed)
Used to have gallbladder issues but had it removed.  Past 2 days having lower back pain and lower abd pain like when had gallstones.  Nauseated for 3 days.  Ate mash potatoes and fish recently.  Earlier ate crackers and oatmeal. Afterwards threw up and it was yellow like bile.  No bleeding.

## 2016-01-13 LAB — RPR: RPR Ser Ql: NONREACTIVE

## 2016-01-13 LAB — HIV ANTIBODY (ROUTINE TESTING W REFLEX): HIV Screen 4th Generation wRfx: NONREACTIVE

## 2016-01-14 LAB — CULTURE, OB URINE

## 2016-03-21 ENCOUNTER — Encounter: Payer: Self-pay | Admitting: Medical

## 2016-03-21 ENCOUNTER — Other Ambulatory Visit (HOSPITAL_COMMUNITY)
Admission: RE | Admit: 2016-03-21 | Discharge: 2016-03-21 | Disposition: A | Discharge status: Home / Self Care | Payer: Medicaid Other | Source: Ambulatory Visit | Attending: Medical | Admitting: Medical

## 2016-03-21 ENCOUNTER — Ambulatory Visit (INDEPENDENT_AMBULATORY_CARE_PROVIDER_SITE_OTHER): Payer: Medicaid Other | Admitting: Medical

## 2016-03-21 VITALS — BP 108/69 | HR 106 | Wt 180.9 lb

## 2016-03-21 DIAGNOSIS — Z113 Encounter for screening for infections with a predominantly sexual mode of transmission: Secondary | ICD-10-CM | POA: Insufficient documentation

## 2016-03-21 DIAGNOSIS — N898 Other specified noninflammatory disorders of vagina: Secondary | ICD-10-CM

## 2016-03-21 DIAGNOSIS — O26892 Other specified pregnancy related conditions, second trimester: Secondary | ICD-10-CM

## 2016-03-21 DIAGNOSIS — Z3492 Encounter for supervision of normal pregnancy, unspecified, second trimester: Secondary | ICD-10-CM

## 2016-03-21 DIAGNOSIS — O34219 Maternal care for unspecified type scar from previous cesarean delivery: Secondary | ICD-10-CM

## 2016-03-21 DIAGNOSIS — Z01419 Encounter for gynecological examination (general) (routine) without abnormal findings: Secondary | ICD-10-CM | POA: Insufficient documentation

## 2016-03-21 DIAGNOSIS — Z3490 Encounter for supervision of normal pregnancy, unspecified, unspecified trimester: Secondary | ICD-10-CM | POA: Insufficient documentation

## 2016-03-21 LAB — POCT URINALYSIS DIP (DEVICE)
Glucose, UA: NEGATIVE mg/dL
HGB URINE DIPSTICK: NEGATIVE
NITRITE: NEGATIVE
Protein, ur: 30 mg/dL — AB
Specific Gravity, Urine: 1.025 (ref 1.005–1.030)
UROBILINOGEN UA: 4 mg/dL — AB (ref 0.0–1.0)
pH: 5.5 (ref 5.0–8.0)

## 2016-03-21 NOTE — Progress Notes (Signed)
   PRENATAL VISIT NOTE  Subjective:  Margaret Jones is a 20 y.o. G2P1001 at 5342w5d being seen today for initial prenatal care visit.  She is currently monitored for the following issues for this low-risk pregnancy and has Supervision of low-risk pregnancy and Previous cesarean section complicating pregnancy, antepartum condition or complication on her problem list.  Patient reports no complaints.  Contractions: Not present. Vag. Bleeding: None.   . Denies leaking of fluid.   The following portions of the patient's history were reviewed and updated as appropriate: allergies, current medications, past family history, past medical history, past social history, past surgical history and problem list. Problem list updated.  Objective:   Vitals:   03/21/16 1309  BP: 108/69  Pulse: (!) 106  Weight: 180 lb 14.4 oz (82.1 kg)    Fetal Status: Fetal Heart Rate (bpm): 148        Glucose: negative  Protein: 1+  General:  Alert, oriented and cooperative. Patient is in no acute distress.  Skin: Skin is warm and dry. No rash noted.   Cardiovascular: Normal heart rate noted  Respiratory: Normal respiratory effort, no problems with respiration noted  Abdomen: Soft, gravid, appropriate for gestational age. Pain/Pressure: Absent     Pelvic:  Cervical exam deferred       Normal vaginal mucosa, cervix with normal contour and no lesions. Small amount of thick, white discharge noted. No bleeding  Extremities: Normal range of motion.  Edema: None  Mental Status: Normal mood and affect. Normal behavior. Normal judgment and thought content.   Assessment and Plan:  Pregnancy: G2P1001 at 2242w5d  1. Encounter for supervision of low-risk pregnancy in second trimester - Prenatal Profile - Hemoglobinopathy Evaluation - Culture, OB Urine - Pain Mgmt, Profile 6 Conf w/o mM, U - US MFM OB COMP + 14 WK; scheduled - AFP/Quad Scr - Cytology - PAP - Wet prep   2. Previous cesarean section complicating pregnancy,  antepartum condition or complication - Desires TOLAC; consent signed  Second trimester warning signs and general obstetric precautions including but not limited to vaginal bleeding, contractions, leaking of fluid and fetal movement were reviewed in detail with the patient. Please refer to After Visit Summary for other counseling recommendations.  Return in about 4 weeks (around 04/18/2016) for LOB.   Marny LowensteinJulie N Jeslin Bazinet, PA-C

## 2016-03-21 NOTE — Patient Instructions (Signed)
Second Trimester of Pregnancy The second trimester is from week 13 through week 28, month 4 through 6. This is often the time in pregnancy that you feel your best. Often times, morning sickness has lessened or quit. You may have more energy, and you may get hungry more often. Your unborn baby (fetus) is growing rapidly. At the end of the sixth month, he or she is about 9 inches long and weighs about 1 pounds. You will likely feel the baby move (quickening) between 18 and 20 weeks of pregnancy. Follow these instructions at home:  Avoid all smoking, herbs, and alcohol. Avoid drugs not approved by your doctor.  Do not use any tobacco products, including cigarettes, chewing tobacco, and electronic cigarettes. If you need help quitting, ask your doctor. You may get counseling or other support to help you quit.  Only take medicine as told by your doctor. Some medicines are safe and some are not during pregnancy.  Exercise only as told by your doctor. Stop exercising if you start having cramps.  Eat regular, healthy meals.  Wear a good support bra if your breasts are tender.  Do not use hot tubs, steam rooms, or saunas.  Wear your seat belt when driving.  Avoid raw meat, uncooked cheese, and liter boxes and soil used by cats.  Take your prenatal vitamins.  Take 1500-2000 milligrams of calcium daily starting at the 20th week of pregnancy until you deliver your baby.  Try taking medicine that helps you poop (stool softener) as needed, and if your doctor approves. Eat more fiber by eating fresh fruit, vegetables, and whole grains. Drink enough fluids to keep your pee (urine) clear or pale yellow.  Take warm water baths (sitz baths) to soothe pain or discomfort caused by hemorrhoids. Use hemorrhoid cream if your doctor approves.  If you have puffy, bulging veins (varicose veins), wear support hose. Raise (elevate) your feet for 15 minutes, 3-4 times a day. Limit salt in your diet.  Avoid heavy  lifting, wear low heals, and sit up straight.  Rest with your legs raised if you have leg cramps or low back pain.  Visit your dentist if you have not gone during your pregnancy. Use a soft toothbrush to brush your teeth. Be gentle when you floss.  You can have sex (intercourse) unless your doctor tells you not to.  Go to your doctor visits. Get help if:  You feel dizzy.  You have mild cramps or pressure in your lower belly (abdomen).  You have a nagging pain in your belly area.  You continue to feel sick to your stomach (nauseous), throw up (vomit), or have watery poop (diarrhea).  You have bad smelling fluid coming from your vagina.  You have pain with peeing (urination). Get help right away if:  You have a fever.  You are leaking fluid from your vagina.  You have spotting or bleeding from your vagina.  You have severe belly cramping or pain.  You lose or gain weight rapidly.  You have trouble catching your breath and have chest pain.  You notice sudden or extreme puffiness (swelling) of your face, hands, ankles, feet, or legs.  You have not felt the baby move in over an hour.  You have severe headaches that do not go away with medicine.  You have vision changes. This information is not intended to replace advice given to you by your health care provider. Make sure you discuss any questions you have with your health care   provider. Document Released: 06/12/2009 Document Revised: 08/24/2015 Document Reviewed: 05/19/2012 Elsevier Interactive Patient Education  2017 [employer]  

## 2016-03-22 ENCOUNTER — Encounter: Payer: Self-pay | Admitting: *Deleted

## 2016-03-22 LAB — AFP, QUAD SCREEN
AFP: 73.9 ng/mL
Age Alone: 1:1130 {titer}
CURR GEST AGE: 17.7 wk
Down Syndrome Scr Risk Est: 1:38500 {titer}
HCG, Total: 15.34 IU/mL
INH: 190.2 pg/mL
Interpretation-AFP: NEGATIVE
MOM FOR AFP: 1.73
MOM FOR INH: 1.22
MoM for hCG: 0.58
Open Spina bifida: NEGATIVE
Tri 18 Scr Risk Est: NEGATIVE
Trisomy 18 (Edward) Syndrome Interp.: <1:77000 {titer}
UE3 MOM: 2.13
uE3 Value: 2.35 ng/mL

## 2016-03-22 LAB — HEMOGLOBINOPATHY EVALUATION
HCT: 32.2 % — ABNORMAL LOW (ref 35.0–45.0)
HGB A: 96.5 % (ref >96.0)
Hemoglobin: 10.1 g/dL — ABNORMAL LOW (ref 11.7–15.5)
Hgb A2 Quant: 2.5 % (ref 1.8–3.5)
MCH: 27.8 pg (ref 27.0–33.0)
MCV: 88.7 fL (ref 80.0–100.0)
RBC: 3.63 MIL/uL — ABNORMAL LOW (ref 3.80–5.10)
RDW: 13.7 % (ref 11.0–15.0)

## 2016-03-22 LAB — CYTOLOGY - PAP: Diagnosis: NEGATIVE

## 2016-03-22 LAB — PRENATAL PROFILE (SOLSTAS)
ANTIBODY SCREEN: NEGATIVE
Basophils Absolute: 0 cells/uL (ref 0–200)
Basophils Relative: 0 %
EOS PCT: 1 %
Eosinophils Absolute: 80 cells/uL (ref 15–500)
HEMATOCRIT: 32.2 % — AB (ref 35.0–45.0)
HEMOGLOBIN: 10.1 g/dL — AB (ref 11.7–15.5)
HIV 1&2 Ab, 4th Generation: NONREACTIVE
Hepatitis B Surface Ag: NEGATIVE
LYMPHS PCT: 21 %
Lymphs Abs: 1680 cells/uL (ref 850–3900)
MCH: 27.8 pg (ref 27.0–33.0)
MCHC: 31.4 g/dL — ABNORMAL LOW (ref 32.0–36.0)
MCV: 88.7 fL (ref 80.0–100.0)
MONOS PCT: 5 %
MPV: 9.7 fL (ref 7.5–12.5)
Monocytes Absolute: 400 cells/uL (ref 200–950)
NEUTROS PCT: 73 %
Neutro Abs: 5840 cells/uL (ref 1500–7800)
Platelets: 358 10*3/uL (ref 140–400)
RBC: 3.63 MIL/uL — AB (ref 3.80–5.10)
RDW: 13.7 % (ref 11.0–15.0)
RH TYPE: POSITIVE
Rubella: 2.91 Index — ABNORMAL HIGH (ref <0.90)
WBC: 8 10*3/uL (ref 3.8–10.8)

## 2016-03-22 LAB — PAIN MGMT, PROFILE 6 CONF W/O MM, U
6 ACETYLMORPHINE: NEGATIVE ng/mL (ref <10)
ALCOHOL METABOLITES: NEGATIVE ng/mL (ref <500)
Amphetamines: NEGATIVE ng/mL (ref <500)
Barbiturates: NEGATIVE ng/mL (ref <300)
Benzodiazepines: NEGATIVE ng/mL (ref <100)
COCAINE METABOLITE: NEGATIVE ng/mL (ref <150)
Creatinine: 245.1 mg/dL (ref >=20.0)
MARIJUANA METABOLITE: NEGATIVE ng/mL (ref <20)
METHADONE METABOLITE: NEGATIVE ng/mL (ref <100)
OPIATES: NEGATIVE ng/mL (ref <100)
OXYCODONE: NEGATIVE ng/mL (ref <100)
Oxidant: NEGATIVE ug/mL (ref <200)
PHENCYCLIDINE: NEGATIVE ng/mL (ref <25)
PLEASE NOTE: 0
pH: 7.79 (ref 4.5–9.0)

## 2016-03-22 LAB — WET PREP, GENITAL
CLUE CELLS WET PREP: NONE SEEN
TRICH WET PREP: NONE SEEN

## 2016-03-22 LAB — GC/CHLAMYDIA PROBE AMP (~~LOC~~) NOT AT ARMC
CHLAMYDIA, DNA PROBE: NEGATIVE
NEISSERIA GONORRHEA: NEGATIVE

## 2016-03-23 LAB — CULTURE, OB URINE

## 2016-03-28 ENCOUNTER — Other Ambulatory Visit: Payer: Self-pay | Admitting: Medical

## 2016-03-28 DIAGNOSIS — B3731 Acute candidiasis of vulva and vagina: Secondary | ICD-10-CM

## 2016-03-28 DIAGNOSIS — B373 Candidiasis of vulva and vagina: Secondary | ICD-10-CM

## 2016-03-28 MED ORDER — TERCONAZOLE 0.8 % VA CREA: 1.0000 | TOPICAL_CREAM | Freq: Every day | VAGINAL | 0 refills | Status: DC

## 2016-03-29 ENCOUNTER — Telehealth: Payer: Self-pay | Admitting: *Deleted

## 2016-03-29 NOTE — Telephone Encounter (Signed)
Attempted to call patient. No answer, vm left stating I am calling with non urgent test results, please return my call at the clinic.

## 2016-03-29 NOTE — Telephone Encounter (Signed)
-----   Message from Marny LowensteinJulie N Wenzel, PA-C sent at 03/28/2016  9:54 AM EST ----- Patient has yeast. Rx for Terconazole sent. Please inform patient.   Thanks,   Raynelle FanningJulie

## 2016-04-04 NOTE — Telephone Encounter (Signed)
No answer or voicemail to leave a message. 

## 2016-04-05 ENCOUNTER — Encounter (HOSPITAL_COMMUNITY): Payer: Self-pay | Admitting: Medical

## 2016-04-05 NOTE — Telephone Encounter (Signed)
LM that this is our third attempt in trying to reach you.  We will send a letter and follow up with you at your next appt.  Letter sent.

## 2016-04-11 ENCOUNTER — Telehealth: Payer: Self-pay | Admitting: General Practice

## 2016-04-11 NOTE — Telephone Encounter (Signed)
Patient called and left message stating she has a question about bleeding. Patient states she was having more than spotting recently. Called patient, no answer- left message stating we are trying to reach you to return your phone call, please call us back

## 2016-04-13 ENCOUNTER — Encounter (HOSPITAL_COMMUNITY): Payer: Self-pay

## 2016-04-13 ENCOUNTER — Inpatient Hospital Stay (HOSPITAL_COMMUNITY)
Admission: AD | Admit: 2016-04-13 | Discharge: 2016-04-14 | Disposition: A | Discharge status: Home / Self Care | Payer: Medicaid Other | Source: Ambulatory Visit | Attending: Family Medicine | Admitting: Family Medicine

## 2016-04-13 DIAGNOSIS — J111 Influenza due to unidentified influenza virus with other respiratory manifestations: Secondary | ICD-10-CM | POA: Insufficient documentation

## 2016-04-13 DIAGNOSIS — Z3A21 21 weeks gestation of pregnancy: Secondary | ICD-10-CM | POA: Diagnosis not present

## 2016-04-13 DIAGNOSIS — R000 Tachycardia, unspecified: Secondary | ICD-10-CM | POA: Insufficient documentation

## 2016-04-13 DIAGNOSIS — O99512 Diseases of the respiratory system complicating pregnancy, second trimester: Secondary | ICD-10-CM | POA: Diagnosis not present

## 2016-04-13 DIAGNOSIS — O9989 Other specified diseases and conditions complicating pregnancy, childbirth and the puerperium: Secondary | ICD-10-CM | POA: Diagnosis not present

## 2016-04-13 DIAGNOSIS — Z3492 Encounter for supervision of normal pregnancy, unspecified, second trimester: Secondary | ICD-10-CM

## 2016-04-13 DIAGNOSIS — J029 Acute pharyngitis, unspecified: Secondary | ICD-10-CM | POA: Diagnosis present

## 2016-04-13 LAB — INFLUENZA PANEL BY PCR (TYPE A & B)
INFLAPCR: POSITIVE — AB
Influenza B By PCR: NEGATIVE

## 2016-04-13 MED ORDER — ALBUTEROL SULFATE (2.5 MG/3ML) 0.083% IN NEBU
2.5000 mg | INHALATION_SOLUTION | Freq: Once | RESPIRATORY_TRACT | Status: AC
  Administered 2016-04-13: 2.5 mg via RESPIRATORY_TRACT
  Filled 2016-04-13: qty 3

## 2016-04-13 MED ORDER — ACETAMINOPHEN 325 MG PO TABS
650.0000 mg | ORAL_TABLET | Freq: Once | ORAL | Status: AC
  Administered 2016-04-13: 650 mg via ORAL
  Filled 2016-04-13: qty 2

## 2016-04-13 MED ORDER — ONDANSETRON 4 MG PO TBDP
4.0000 mg | ORAL_TABLET | Freq: Once | ORAL | Status: AC
  Administered 2016-04-13: 4 mg via ORAL
  Filled 2016-04-13: qty 1

## 2016-04-13 MED ORDER — OSELTAMIVIR PHOSPHATE 75 MG PO CAPS
75.0000 mg | ORAL_CAPSULE | Freq: Two times a day (BID) | ORAL | Status: DC
  Administered 2016-04-13: 75 mg via ORAL
  Filled 2016-04-13 (×2): qty 1

## 2016-04-13 NOTE — MAU Note (Addendum)
Cough for 2 days. Throat sore. Chest hurts to cough and SOB after coughs. Temp 100 earlier today[place] vag bleeding on occ when wiped for 2 days but did not go to hospital. Had had sex the day before that.

## 2016-04-13 NOTE — MAU Provider Note (Signed)
History     CSN: [id number]  Arrival date and time: 04/13/16 2121   None     No chief complaint on file.  HPI  Patient is a 21yo G2P1001 at 6475w0d who presents with "pneumonia symptoms". Patient states that she has had a sore throat, runny nose, subjective fever, and non productive cough for the last 2 days. Of note, her son developed similar symptoms this morning. Admits to some intermittent nausea and decreased appetite. Is able to maintain her hydration and tolerating PO. Voiding and stooling appropriately. States that she has had no complication with her pregnancy thus far. Positive fetal movement.   OB History    Gravida Para Term Preterm AB Living   2 1 1  0 0 1   SAB TAB Ectopic Multiple Live Births   0 0 0 0 1      Past Medical History:  Diagnosis Date  . Anemia   . Gall stones     Past Surgical History:  Procedure Laterality Date  . CESAREAN SECTION    . CHOLECYSTECTOMY    . ERCP N/A 09/21/2015   Procedure: ENDOSCOPIC RETROGRADE CHOLANGIOPANCREATOGRAPHY (ERCP);  Surgeon: Vida RiggerMarc Magod, MD;  [facility];  Service: Endoscopy;  Laterality: N/A;    Family History  Problem Relation Age of Onset  . Asthma Mother   . Heart disease Mother   . Diabetes Father   . Hypertension Father     Social History  Substance Use Topics  . Smoking status: Never Smoker  . Smokeless tobacco: Never Used  . Alcohol use No    Allergies:  Allergies  Allergen Reactions  . Rocephin [Ceftriaxone] Anaphylaxis    Prescriptions Prior to Admission  Medication Sig Dispense Refill Last Dose  . terconazole (TERAZOL 3) 0.8 % vaginal cream Place 1 applicator vaginally at bedtime. 20 g 0     Review of Systems  Constitutional: Positive for appetite change, fatigue and fever.  HENT: Positive for congestion, rhinorrhea and sore throat.   Respiratory: Positive for cough and wheezing. Negative for shortness of breath.   Cardiovascular: Negative for chest pain.  Gastrointestinal:  Positive for nausea. Negative for abdominal pain, diarrhea and vomiting.  Genitourinary: Negative for dysuria, vaginal bleeding and vaginal discharge.  Skin: Negative for rash.  Neurological: Negative for weakness.   Physical Exam   Blood pressure 131/70, pulse (!) 126, temperature 101 F (38.3 C), resp. rate 20, height 5\' 2"  (1.575 m), weight 83.7 kg (184 lb 9.6 oz), last menstrual period 11/18/2015, SpO2 99 %.  Physical Exam  Constitutional: She appears well-developed and well-nourished.  HENT:  Head: Normocephalic and atraumatic.  Right Ear: Tympanic membrane and external ear normal.  Left Ear: Tympanic membrane and external ear normal.  Mouth/Throat: Oropharynx is clear and moist. No oropharyngeal exudate.  Neck: Normal range of motion. Neck supple.  Cardiovascular: Regular rhythm, normal heart sounds and intact distal pulses.  Tachycardia present.   No murmur heard. Respiratory: Effort normal. No respiratory distress. She has wheezes (Faint at surgery wheezing in bilateral upper lung fields). She exhibits no tenderness.  GI: Bowel sounds are normal. There is no tenderness.  Lymphadenopathy:    She has no cervical adenopathy.    MAU Course  Procedures  MDM Vital signs notable for tachycardia into the 120s and 130s Physical exam showing wheezing in bilateral upper lung fields, albuterol nebulizer treatment given Flu test ordered, patient was positive for influenza. Tamiflu started Patient continued to have some wheezing, albuterol nebulizer treatment given again  1 L lactated ringer bolus 2 given which improved tachycardia Doppler showing fetal heart rate 166  Assessment and Plan   Influenza:  -Tamiflu 5 days -Supportive care; fluids, rest, humidifier, OTC cough drops, Tylenol when necessary -Return precautions discussed -Albuterol inhaler given for patient in case she has any shortness of breath or wheezing   Tachycardia: Likely multifactorial in the setting of fever  and dehydration. Additionally patient seemed to have increased heart rate after nebulizer treatment which is expected. Improved after 2 L of lactated Ringer's bolus. -Return precautions discussed -Advised plenty of oral rehydration  Beaulah Dinning 04/14/2016, 12:57 AM   OB FELLOW DISCHARGE ATTESTATION  I have seen and examined this patient and agree with above documentation in the resident's note.   Ernestina Penna, MD 7:42 AM

## 2016-04-14 DIAGNOSIS — J111 Influenza due to unidentified influenza virus with other respiratory manifestations: Secondary | ICD-10-CM | POA: Diagnosis not present

## 2016-04-14 DIAGNOSIS — O9989 Other specified diseases and conditions complicating pregnancy, childbirth and the puerperium: Secondary | ICD-10-CM | POA: Diagnosis not present

## 2016-04-14 MED ORDER — LACTATED RINGERS IV BOLUS (SEPSIS)
1000.0000 mL | Freq: Once | INTRAVENOUS | Status: AC
  Administered 2016-04-14: 1000 mL via INTRAVENOUS

## 2016-04-14 MED ORDER — ALBUTEROL SULFATE HFA 108 (90 BASE) MCG/ACT IN AERS: 2.0000 | INHALATION_SPRAY | Freq: Four times a day (QID) | RESPIRATORY_TRACT | 0 refills | Status: DC | PRN

## 2016-04-14 MED ORDER — ALBUTEROL SULFATE (2.5 MG/3ML) 0.083% IN NEBU
2.5000 mg | INHALATION_SOLUTION | Freq: Once | RESPIRATORY_TRACT | Status: AC
  Administered 2016-04-14: 2.5 mg via RESPIRATORY_TRACT
  Filled 2016-04-14: qty 3

## 2016-04-14 MED ORDER — OSELTAMIVIR PHOSPHATE 75 MG PO CAPS: 75.0000 mg | ORAL_CAPSULE | Freq: Two times a day (BID) | ORAL | 0 refills | Status: AC

## 2016-04-14 NOTE — Progress Notes (Signed)
Dr Jonathon JordanGambino notified of temp of 101. No new orders

## 2016-04-14 NOTE — Discharge Instructions (Signed)
You have the flu virus. We have given you medication to treat the flu. Your heart rate was high due to dehydration and fever so we gave you 2 Liters of fluid through your IV. We also gave you to albuterol breathing treatments to help your wheezing. Please come back to the [facility] if your wheezing gets worse or you have worsening shortness of breath or your fever does not resolve with Tylenol. Please take Tamiflu as prescribed He can use the albuterol inhaler if you have any wheezing or shortness of breath. Please try to get plenty of rest and fluids.   Influenza, Adult Influenza, more commonly known as the flu, is a viral infection that primarily affects the respiratory tract. The respiratory tract includes organs that help you breathe, such as the lungs, nose, and throat. The flu causes many common cold symptoms, as well as a high fever and body aches. The flu spreads easily from person to person (is contagious). Getting a flu shot (influenza vaccination) every year is the best way to prevent influenza. What are the causes? Influenza is caused by a virus. You can catch the virus by:  Breathing in droplets from an infected person's cough or sneeze.  Touching something that was recently contaminated with the virus and then touching your mouth, nose, or eyes. What increases the risk? The following factors may make you more likely to get the flu:  Not cleaning your hands frequently with soap and water or alcohol-based hand sanitizer.  Having close contact with many people during cold and flu season.  Touching your mouth, eyes, or nose without washing or sanitizing your hands first.  Not drinking enough fluids or not eating a healthy diet.  Not getting enough sleep or exercise.  Being under a high amount of stress.  Not getting a yearly (annual) flu shot. You may be at a higher risk of complications from the flu, such as a severe lung infection (pneumonia), if you:  Are over the age of  23.  Are pregnant.  Have a weakened disease-fighting system (immune system). You may have a weakened immune system if you:  Have HIV or AIDS.  Are undergoing chemotherapy.  Aretaking medicines that reduce the activity of (suppress) the immune system.  Have a long-term (chronic) illness, such as heart disease, kidney disease, diabetes, or lung disease.  Have a liver disorder.  Are obese.  Have anemia. What are the signs or symptoms? Symptoms of this condition typically last 4-10 days and may include:  Fever.  Chills.  Headache, body aches, or muscle aches.  Sore throat.  Cough.  Runny or congested nose.  Chest discomfort and cough.  Poor appetite.  Weakness or tiredness (fatigue).  Dizziness.  Nausea or vomiting. How is this diagnosed? This condition may be diagnosed based on your medical history and a physical exam. Your health care provider may do a nose or throat swab test to confirm the diagnosis. How is this treated? If influenza is detected early, you can be treated with antiviral medicine that can reduce the length of your illness and the severity of your symptoms. This medicine may be given by mouth (orally) or through an IV tube that is inserted in one of your veins. The goal of treatment is to relieve symptoms by taking care of yourself at home. This may include taking over-the-counter medicines, drinking plenty of fluids, and adding humidity to the air in your home. In some cases, influenza goes away on its own. Severe influenza or  complications from influenza may be treated in a hospital. Follow these instructions at home:  Take over-the-counter and prescription medicines only as told by your health care provider.  Use a cool mist humidifier to add humidity to the air in your home. This can make breathing easier.  Rest as needed.  Drink enough fluid to keep your urine clear or pale yellow.  Cover your mouth and nose when you cough or  sneeze.  Wash your hands with soap and water often, especially after you cough or sneeze. If soap and water are not available, use hand sanitizer.  Stay home from work or school as told by your health care provider. Unless you are visiting your health care provider, try to avoid leaving home until your fever has been gone for 24 hours without the use of medicine.  Keep all follow-up visits as told by your health care provider. This is important. How is this prevented?  Getting an annual flu shot is the best way to avoid getting the flu. You may get the flu shot in late summer, fall, or winter. Ask your health care provider when you should get your flu shot.  Wash your hands often or use hand sanitizer often.  Avoid contact with people who are sick during cold and flu season.  Eat a healthy diet, drink plenty of fluids, get enough sleep, and exercise regularly. Contact a health care provider if:  You develop new symptoms.  You have:  Chest pain.  Diarrhea.  A fever.  Your cough gets worse.  You produce more mucus.  You feel nauseous or you vomit. Get help right away if:  You develop shortness of breath or difficulty breathing.  Your skin or nails turn a bluish color.  You have severe pain or stiffness in your neck.  You develop a sudden headache or sudden pain in your face or ear.  You cannot stop vomiting. This information is not intended to replace advice given to you by your health care provider. Make sure you discuss any questions you have with your health care provider. Document Released: 03/15/2000 Document Revised: 08/24/2015 Document Reviewed: 01/10/2015 Elsevier Interactive Patient Education  2017 [employer].

## 2016-04-14 NOTE — Progress Notes (Signed)
Dr Jonathon JordanGambino updated as to pt's status. Order for second bolus and will be down to reassess pt after finishing in [facility]

## 2016-04-14 NOTE — Progress Notes (Addendum)
Dr Jonathon JordanGambino notified of pt's pulse 140s and pulse ox 98. Will given IVFs.

## 2016-04-14 NOTE — Progress Notes (Addendum)
Written and verbal d/c instructions given and understanding voiced. Only use inhaler as needed. Do not use inhaler today before 1000 since just had breathing tx.

## 2016-04-15 ENCOUNTER — Ambulatory Visit (HOSPITAL_COMMUNITY)
Admission: RE | Admit: 2016-04-15 | Discharge: 2016-04-15 | Disposition: A | Discharge status: Home / Self Care | Payer: Medicaid Other | Source: Ambulatory Visit | Attending: Medical | Admitting: Medical

## 2016-04-15 ENCOUNTER — Other Ambulatory Visit: Payer: Self-pay | Admitting: Medical

## 2016-04-15 DIAGNOSIS — E669 Obesity, unspecified: Secondary | ICD-10-CM

## 2016-04-15 DIAGNOSIS — Z3492 Encounter for supervision of normal pregnancy, unspecified, second trimester: Secondary | ICD-10-CM

## 2016-04-15 DIAGNOSIS — Z3A21 21 weeks gestation of pregnancy: Secondary | ICD-10-CM | POA: Diagnosis not present

## 2016-04-15 DIAGNOSIS — Z3689 Encounter for other specified antenatal screening: Secondary | ICD-10-CM | POA: Insufficient documentation

## 2016-04-23 ENCOUNTER — Encounter: Payer: Medicaid Other | Admitting: Medical

## 2016-04-23 ENCOUNTER — Encounter: Payer: Self-pay | Admitting: Family

## 2016-05-02 ENCOUNTER — Encounter: Payer: Medicaid Other | Admitting: Family

## 2016-06-11 ENCOUNTER — Encounter: Payer: Medicaid Other | Admitting: Student

## 2016-06-11 ENCOUNTER — Encounter: Payer: Medicaid Other | Admitting: Advanced Practice Midwife

## 2016-06-24 ENCOUNTER — Encounter: Payer: Self-pay | Admitting: Obstetrics & Gynecology

## 2016-06-25 ENCOUNTER — Ambulatory Visit (INDEPENDENT_AMBULATORY_CARE_PROVIDER_SITE_OTHER): Payer: Medicaid Other | Admitting: Advanced Practice Midwife

## 2016-06-25 ENCOUNTER — Encounter: Payer: Self-pay | Admitting: Advanced Practice Midwife

## 2016-06-25 VITALS — BP 104/60 | HR 107 | Wt 188.9 lb

## 2016-06-25 DIAGNOSIS — O0933 Supervision of pregnancy with insufficient antenatal care, third trimester: Secondary | ICD-10-CM

## 2016-06-25 DIAGNOSIS — Z3493 Encounter for supervision of normal pregnancy, unspecified, third trimester: Secondary | ICD-10-CM

## 2016-06-25 DIAGNOSIS — K0889 Other specified disorders of teeth and supporting structures: Secondary | ICD-10-CM

## 2016-06-25 DIAGNOSIS — O34219 Maternal care for unspecified type scar from previous cesarean delivery: Secondary | ICD-10-CM

## 2016-06-25 NOTE — Progress Notes (Signed)
   PRENATAL VISIT NOTE  Subjective:  Margaret Jones is a 21 y.o. G2P1001 at 6872w3d being seen today for ongoing prenatal care.  She is currently monitored for the following issues for this low-risk pregnancy and has Supervision of low-risk pregnancy and Previous cesarean section complicating pregnancy, antepartum condition or complication on her problem list.  Patient reports tooth pain. Went to dentist who recommended tooth extraction, but would not perform extraction w/out dental letter. Pt denies fever, chills.  Contractions: Irregular. Vag. Bleeding: None.  Movement: Present. Denies leaking of fluid.   The following portions of the patient's history were reviewed and updated as appropriate: allergies, current medications, past family history, past medical history, past social history, past surgical history and problem list. Problem list updated.  Objective:   Vitals:   06/25/16 1620  BP: 104/60  Pulse: (!) 107  Weight: 188 lb 14.4 oz (85.7 kg)    Fetal Status: Fetal Heart Rate (bpm): 143 Fundal Height: 32 cm Movement: Present  Presentation: Vertex  General:  Alert, oriented and cooperative. Patient is in no acute distress.  Skin: Skin is warm and dry. No rash noted.   Cardiovascular: Normal heart rate noted  Respiratory: Normal respiratory effort, no problems with respiration noted  Abdomen: Soft, gravid, appropriate for gestational age. Pain/Pressure: Present     Pelvic:  Cervical exam deferred        Extremities: Normal range of motion.  Edema: None  Mental Status: Normal mood and affect. Normal behavior. Normal judgment and thought content.   Assessment and Plan:  Pregnancy: G2P1001 at 2072w3d  1. Encounter for supervision of low-risk pregnancy in third trimester  2. Limited PNC.  - 2 hour  GTT ordered  3. Dental pain - Dental letter given.  Preterm labor symptoms and general obstetric precautions including but not limited to vaginal bleeding, contractions, leaking of fluid  and fetal movement were reviewed in detail with the patient. Please refer to After Visit Summary for other counseling recommendations.  Return in about 2 weeks (around 07/09/2016) for ROB.   Margaret Jones, CNM

## 2016-06-25 NOTE — Patient Instructions (Addendum)
Braxton Hicks Contractions Contractions of the uterus can occur throughout pregnancy, but they are not always a sign that you are in labor. You may have practice contractions called Braxton Hicks contractions. These false labor contractions are sometimes confused with true labor. What are Braxton Hicks contractions? Braxton Hicks contractions are tightening movements that occur in the muscles of the uterus before labor. Unlike true labor contractions, these contractions do not result in opening (dilation) and thinning of the cervix. Toward the end of pregnancy (32-34 weeks), Braxton Hicks contractions can happen more often and may become stronger. These contractions are sometimes difficult to tell apart from true labor because they can be very uncomfortable. You should not feel embarrassed if you go to the hospital with false labor. Sometimes, the only way to tell if you are in true labor is for your health care provider to look for changes in the cervix. The health care provider will do a physical exam and may monitor your contractions. If you are not in true labor, the exam should show that your cervix is not dilating and your water has not broken. If there are no prenatal problems or other health problems associated with your pregnancy, it is completely safe for you to be sent home with false labor. You may continue to have Braxton Hicks contractions until you go into true labor. How can I tell the difference between true labor and false labor?  Differences ? False labor ? Contractions last 30-70 seconds.: Contractions are usually shorter and not as strong as true labor contractions. ? Contractions become very regular.: Contractions are usually irregular. ? Discomfort is usually felt in the top of the uterus, and it spreads to the lower abdomen and low back.: Contractions are often felt in the front of the lower abdomen and in the groin. ? Contractions do not go away with walking.: Contractions may  go away when you walk around or change positions while lying down. ? Contractions usually become more intense and increase in frequency.: Contractions get weaker and are shorter-lasting as time goes on. ? The cervix dilates and gets thinner.: The cervix usually does not dilate or become thin. Follow these instructions at home:  Take over-the-counter and prescription medicines only as told by your health care provider.  Keep up with your usual exercises and follow other instructions from your health care provider.  Eat and drink lightly if you think you are going into labor.  If Braxton Hicks contractions are making you uncomfortable: ? Change your position from lying down or resting to walking, or change from walking to resting. ? Sit and rest in a tub of warm water. ? Drink enough fluid to keep your urine clear or pale yellow. Dehydration may cause these contractions. ? Do slow and deep breathing several times an hour.  Keep all follow-up prenatal visits as told by your health care provider. This is important. Contact a health care provider if:  You have a fever.  You have continuous pain in your abdomen. Get help right away if:  Your contractions become stronger, more regular, and closer together.  You have fluid leaking or gushing from your vagina.  You pass blood-tinged mucus (bloody show).  You have bleeding from your vagina.  You have low back pain that you never had before.  You feel your baby's head pushing down and causing pelvic pressure.  Your baby is not moving inside you as much as it used to. Summary  Contractions that occur before labor are   called Braxton Hicks contractions, false labor, or practice contractions.  Braxton Hicks contractions are usually shorter, weaker, farther apart, and less regular than true labor contractions. True labor contractions usually become progressively stronger and regular and they become more frequent.  Manage discomfort from  [facility] contractions by changing position, resting in a warm bath, drinking plenty of water, or practicing deep breathing. This information is not intended to replace advice given to you by your health care provider. Make sure you discuss any questions you have with your health care provider. Document Released: 03/18/2005 Document Revised: 02/05/2016 Document Reviewed: 02/05/2016 Elsevier Interactive Patient Education  2017 [employer]   Tdap Vaccine (Tetanus, Diphtheria and Pertussis): What You Need to Know 1. Why get vaccinated? Tetanus, diphtheria and pertussis are very serious diseases. Tdap vaccine can protect us from these diseases. And, Tdap vaccine given to pregnant women can protect newborn babies against pertussis. TETANUS (Lockjaw) is rare in the [place] today. It causes painful muscle tightening and stiffness, usually all over the body.  It can lead to tightening of muscles in the head and neck so you can't open your mouth, swallow, or sometimes even breathe. Tetanus kills about 1 out of 10 people who are infected even after receiving the best medical care. DIPHTHERIA is also rare in the [place] today. It can cause a thick coating to form in the back of the throat.  It can lead to breathing problems, heart failure, paralysis, and death. PERTUSSIS (Whooping Cough) causes severe coughing spells, which can cause difficulty breathing, vomiting and disturbed sleep.  It can also lead to weight loss, incontinence, and rib fractures. Up to 2 in 100 adolescents and 5 in 100 adults with pertussis are hospitalized or have complications, which could include pneumonia or death. These diseases are caused by bacteria. Diphtheria and pertussis are spread from person to person through secretions from coughing or sneezing. Tetanus enters the body through cuts, scratches, or wounds. Before vaccines, as many as 200,000 cases of diphtheria, 200,000 cases of pertussis, and hundreds of  cases of tetanus, were reported in the [place] each year. Since vaccination began, reports of cases for tetanus and diphtheria have dropped by about 99% and for pertussis by about 80%. 2. Tdap vaccine Tdap vaccine can protect adolescents and adults from tetanus, diphtheria, and pertussis. One dose of Tdap is routinely given at age 23 or 1612. People who did not get Tdap at that age should get it as soon as possible. Tdap is especially important for healthcare professionals and anyone having close contact with a baby younger than 12 months. Pregnant women should get a dose of Tdap during every pregnancy, to protect the newborn from pertussis. Infants are most at risk for severe, life-threatening complications from pertussis. Another vaccine, called Td, protects against tetanus and diphtheria, but not pertussis. A Td booster should be given every 10 years. Tdap may be given as one of these boosters if you have never gotten Tdap before. Tdap may also be given after a severe cut or burn to prevent tetanus infection. Your doctor or the person giving you the vaccine can give you more information. Tdap may safely be given at the same time as other vaccines. 3. Some people should not get this vaccine  A person who has ever had a life-threatening allergic reaction after a previous dose of any diphtheria, tetanus or pertussis containing vaccine, OR has a severe allergy to any part of this vaccine, should not get Tdap vaccine.  Tell the person giving the vaccine about any severe allergies.  Anyone who had coma or long repeated seizures within 7 days after a childhood dose of DTP or DTaP, or a previous dose of Tdap, should not get Tdap, unless a cause other than the vaccine was found. They can still get Td.  Talk to your doctor if you:  have seizures or another nervous system problem,  had severe pain or swelling after any vaccine containing diphtheria, tetanus or pertussis,  ever had a condition called  Guillain-Barr Syndrome (GBS),  aren't feeling well on the day the shot is scheduled. 4. Risks With any medicine, including vaccines, there is a chance of side effects. These are usually mild and go away on their own. Serious reactions are also possible but are rare. Most people who get Tdap vaccine do not have any problems with it. Mild problems following Tdap:  (Did not interfere with activities)  Pain where the shot was given (about 3 in 4 adolescents or 2 in 3 adults)  Redness or swelling where the shot was given (about 1 person in 5)  Mild fever of at least 100.48F (up to about 1 in 25 adolescents or 1 in 100 adults)  Headache (about 3 or 4 people in 10)  Tiredness (about 1 person in 3 or 4)  Nausea, vomiting, diarrhea, stomach ache (up to 1 in 4 adolescents or 1 in 10 adults)  Chills, sore joints (about 1 person in 10)  Body aches (about 1 person in 3 or 4)  Rash, swollen glands (uncommon) Moderate problems following Tdap:  (Interfered with activities, but did not require medical attention)  Pain where the shot was given (up to 1 in 5 or 6)  Redness or swelling where the shot was given (up to about 1 in 16 adolescents or 1 in 12 adults)  Fever over 102F (about 1 in 100 adolescents or 1 in 250 adults)  Headache (about 1 in 7 adolescents or 1 in 10 adults)  Nausea, vomiting, diarrhea, stomach ache (up to 1 or 3 people in 100)  Swelling of the entire arm where the shot was given (up to about 1 in 500). Severe problems following Tdap:  (Unable to perform usual activities; required medical attention)  Swelling, severe pain, bleeding and redness in the arm where the shot was given (rare). Problems that could happen after any vaccine:   People sometimes faint after a medical procedure, including vaccination. Sitting or lying down for about 15 minutes can help prevent fainting, and injuries caused by a fall. Tell your doctor if you feel dizzy, or have vision changes or  ringing in the ears.  Some people get severe pain in the shoulder and have difficulty moving the arm where a shot was given. This happens very rarely.  Any medication can cause a severe allergic reaction. Such reactions from a vaccine are very rare, estimated at fewer than 1 in a million doses, and would happen within a few minutes to a few hours after the vaccination. As with any medicine, there is a very remote chance of a vaccine causing a serious injury or death. The safety of vaccines is always being monitored. For more information, visit: http://floyd.org/ 5. What if there is a serious problem? What should I look for?  Look for anything that concerns you, such as signs of a severe allergic reaction, very high fever, or unusual behavior. Signs of a severe allergic reaction can include hives, swelling of the face and  throat, difficulty breathing, a fast heartbeat, dizziness, and weakness. These would usually start a few minutes to a few hours after the vaccination. What should I do?   If you think it is a severe allergic reaction or other emergency that can't wait, call 9-1-1 or get the person to the nearest hospital. Otherwise, call your doctor.  Afterward, the reaction should be reported to the Vaccine Adverse Event Reporting System (VAERS). Your doctor might file this report, or you can do it yourself through the VAERS web site at www.vaers.LAgents.no, or by calling 1-(212)107-3183.  VAERS does not give medical advice. 6. The National Vaccine Injury Compensation Program The [employer] Vaccine Injury Compensation Program (VICP) is a federal program that was created to compensate people who may have been injured by certain vaccines. Persons who believe they may have been injured by a vaccine can learn about the program and about filing a claim by calling 1-630-226-7744 or visiting the VICP website at SpiritualWord.at. There is a time limit to file a claim for  compensation. 7. How can I learn more?  Ask your doctor. He or she can give you the vaccine package insert or suggest other sources of information.  Call your local or state health department.  Contact the Centers for Disease Control and Prevention (CDC):  Call 351-641-3099 (1-800-CDC-INFO) or  Visit CDC's website at PicCapture.uy CDC Tdap Vaccine VIS (05/25/13) This information is not intended to replace advice given to you by your health care provider. Make sure you discuss any questions you have with your health care provider. Document Released: 09/17/2011 Document Revised: 12/07/2015 Document Reviewed: 12/07/2015 Elsevier Interactive Patient Education  2017 [employer].

## 2016-06-26 DIAGNOSIS — O0933 Supervision of pregnancy with insufficient antenatal care, third trimester: Secondary | ICD-10-CM | POA: Insufficient documentation

## 2016-07-10 ENCOUNTER — Ambulatory Visit (INDEPENDENT_AMBULATORY_CARE_PROVIDER_SITE_OTHER): Payer: Medicaid Other | Admitting: Obstetrics and Gynecology

## 2016-07-10 VITALS — BP 122/64 | HR 99 | Wt 189.5 lb

## 2016-07-10 DIAGNOSIS — O0933 Supervision of pregnancy with insufficient antenatal care, third trimester: Secondary | ICD-10-CM

## 2016-07-10 DIAGNOSIS — O34219 Maternal care for unspecified type scar from previous cesarean delivery: Secondary | ICD-10-CM

## 2016-07-10 DIAGNOSIS — O99019 Anemia complicating pregnancy, unspecified trimester: Secondary | ICD-10-CM

## 2016-07-10 DIAGNOSIS — Z3490 Encounter for supervision of normal pregnancy, unspecified, unspecified trimester: Secondary | ICD-10-CM

## 2016-07-10 NOTE — Progress Notes (Signed)
   PRENATAL VISIT NOTE  Subjective:  Margaret Jones is a 21 y.o. G2P1001 at [redacted]w[redacted]d being seen today for ongoing prenatal care.  She is currently monitored for the following issues for this low-risk pregnancy and has Supervision of low-risk pregnancy; Previous cesarean section complicating pregnancy, antepartum condition or complication; and Limited prenatal care in third trimester on her problem list.  Patient reports backache. Patient fell on her bottom yesterday. Has had some backache. She denies contractions, bleeding. Baby is moving well.   Contractions: Not present. Vag. Bleeding: None.  Movement: Present. Denies leaking of fluid.   The following portions of the patient's history were reviewed and updated as appropriate: allergies, current medications, past family history, past medical history, past social history, past surgical history and problem list. Problem list updated.  Objective:   Vitals:   07/10/16 1108  BP: 122/64  Pulse: 99  Weight: 189 lb 8 oz (86 kg)    Fetal Status: Fetal Heart Rate (bpm): 148 Fundal Height: 33 cm Movement: Present    General:  Alert, oriented and cooperative. Patient is in no acute distress.  Skin: Skin is warm and dry. No rash noted.   Cardiovascular: Normal heart rate noted  Respiratory: Normal respiratory effort, no problems with respiration noted  Abdomen: Soft, gravid, appropriate for gestational age. Pain/Pressure: Absent     Pelvic:  Cervical exam deferred      Extremities: Normal range of motion.  Edema: None  Mental Status: Normal mood and affect. Normal behavior. Normal judgment and thought content.   Assessment and Plan:  Pregnancy: G2P1001 at [redacted]w[redacted]d   1. Encounter for supervision of low-risk pregnancy, antepartum  - Glucose Tolerance, 2 Hours w/1 Hour - CBC - RPR - HIV antibody (with reflex)  2. Previous cesarean section complicating pregnancy, antepartum condition or complication  - Consent signed for VBAC  3. Limited prenatal  care in third trimester   There are no diagnoses linked to this encounter. Preterm labor symptoms and general obstetric precautions including but not limited to vaginal bleeding, contractions, leaking of fluid and fetal movement were reviewed in detail with the patient. Please refer to After Visit Summary for other counseling recommendations.  Return in about 2 weeks (around 07/24/2016).   Duane Lope, NP

## 2016-07-11 LAB — CBC
Hematocrit: 30.2 % — ABNORMAL LOW (ref 34.0–46.6)
Hemoglobin: 9.1 g/dL — ABNORMAL LOW (ref 11.1–15.9)
MCH: 23.2 pg — ABNORMAL LOW (ref 26.6–33.0)
MCHC: 30.1 g/dL — ABNORMAL LOW (ref 31.5–35.7)
MCV: 77 fL — ABNORMAL LOW (ref 79–97)
PLATELETS: 331 10*3/uL (ref 150–379)
RBC: 3.92 x10E6/uL (ref 3.77–5.28)
RDW: 15.3 % (ref 12.3–15.4)
WBC: 7.8 10*3/uL (ref 3.4–10.8)

## 2016-07-11 LAB — GLUCOSE TOLERANCE, 2 HOURS W/ 1HR
GLUCOSE, 1 HOUR: 127 mg/dL (ref 65–179)
GLUCOSE, 2 HOUR: 100 mg/dL (ref 65–152)
GLUCOSE, FASTING: 67 mg/dL (ref 65–91)

## 2016-07-11 LAB — HIV ANTIBODY (ROUTINE TESTING W REFLEX): HIV SCREEN 4TH GENERATION: NONREACTIVE

## 2016-07-11 LAB — RPR: RPR: NONREACTIVE

## 2016-07-15 ENCOUNTER — Encounter: Payer: Self-pay | Admitting: General Practice

## 2016-07-17 ENCOUNTER — Inpatient Hospital Stay (HOSPITAL_COMMUNITY)
Admission: AD | Admit: 2016-07-17 | Discharge: 2016-07-17 | Disposition: A | Discharge status: Home / Self Care | Payer: Medicaid Other | Source: Ambulatory Visit | Attending: Obstetrics & Gynecology | Admitting: Obstetrics & Gynecology

## 2016-07-17 ENCOUNTER — Encounter (HOSPITAL_COMMUNITY): Payer: Self-pay | Admitting: *Deleted

## 2016-07-17 DIAGNOSIS — O2343 Unspecified infection of urinary tract in pregnancy, third trimester: Secondary | ICD-10-CM

## 2016-07-17 DIAGNOSIS — Z833 Family history of diabetes mellitus: Secondary | ICD-10-CM | POA: Insufficient documentation

## 2016-07-17 DIAGNOSIS — Z825 Family history of asthma and other chronic lower respiratory diseases: Secondary | ICD-10-CM | POA: Insufficient documentation

## 2016-07-17 DIAGNOSIS — N898 Other specified noninflammatory disorders of vagina: Secondary | ICD-10-CM | POA: Insufficient documentation

## 2016-07-17 DIAGNOSIS — Z8249 Family history of ischemic heart disease and other diseases of the circulatory system: Secondary | ICD-10-CM | POA: Diagnosis not present

## 2016-07-17 DIAGNOSIS — O26893 Other specified pregnancy related conditions, third trimester: Secondary | ICD-10-CM | POA: Diagnosis not present

## 2016-07-17 DIAGNOSIS — Z9049 Acquired absence of other specified parts of digestive tract: Secondary | ICD-10-CM | POA: Diagnosis not present

## 2016-07-17 DIAGNOSIS — R102 Pelvic and perineal pain: Secondary | ICD-10-CM | POA: Diagnosis not present

## 2016-07-17 DIAGNOSIS — Z3A34 34 weeks gestation of pregnancy: Secondary | ICD-10-CM | POA: Diagnosis not present

## 2016-07-17 DIAGNOSIS — Z881 Allergy status to other antibiotic agents status: Secondary | ICD-10-CM | POA: Diagnosis not present

## 2016-07-17 DIAGNOSIS — O479 False labor, unspecified: Secondary | ICD-10-CM

## 2016-07-17 LAB — URINALYSIS, ROUTINE W REFLEX MICROSCOPIC
BILIRUBIN URINE: NEGATIVE
Glucose, UA: NEGATIVE mg/dL
Hgb urine dipstick: NEGATIVE
KETONES UR: 5 mg/dL — AB
Nitrite: POSITIVE — AB
PROTEIN: 30 mg/dL — AB
Specific Gravity, Urine: 1.02 (ref 1.005–1.030)
pH: 5 (ref 5.0–8.0)

## 2016-07-17 LAB — WET PREP, GENITAL
Clue Cells Wet Prep HPF POC: NONE SEEN
Sperm: NONE SEEN
Trich, Wet Prep: NONE SEEN
YEAST WET PREP: NONE SEEN

## 2016-07-17 MED ORDER — SULFAMETHOXAZOLE-TRIMETHOPRIM 800-160 MG PO TABS: 1.0000 | ORAL_TABLET | Freq: Two times a day (BID) | ORAL | 0 refills | Status: AC

## 2016-07-17 NOTE — Discharge Instructions (Signed)
Asymptomatic Bacteriuria Asymptomatic bacteriuria is the presence of a large number of bacteria in the urine without the usual symptoms of burning or frequent urination. What are the causes? This condition is caused by an increase in bacteria in the urine. This increase can be caused by:  Bacteria entering the urinary tract, such as during sex.  A blockage in the urinary tract, such as from kidney stones or a tumor.  Bladder problems that prevent the bladder from emptying.  What increases the risk? You are more likely to develop this condition if:  You have diabetes mellitus.  You are an elderly adult, especially if you are also in a long-term care facility.  You are pregnant and in the first trimester.  You have kidney stones.  You are female.  You have had a kidney transplant.  You have a leaky kidney tube valve (reflux).  You had a urinary catheter for a long period of time.  What are the signs or symptoms? There are no symptoms of this condition. How is this diagnosed? This condition is diagnosed with a urine test. Because this condition does not cause symptoms, it is usually diagnosed when a urine sample is taken to treat or diagnose another condition, such as pregnancy or kidney problems. Most women who are in their first trimester of pregnancy are screened for asymptomatic bacteriuria. How is this treated? Usually, treatment is not needed for this condition. Treating the condition can lead to other problems, such as a yeast infection or the growth of bacteria that do not respond to treatment (antibiotic-resistant bacteria). Some people, such as pregnant women and people with kidney transplants, do need treatment with antibiotic medicines to prevent kidney infection (pyelonephritis). In pregnant women, kidney infection can lead to premature labor, fetal growth restriction, or newborn death. Follow these instructions at home: Medicines  Take over-the-counter and  prescription medicines only as told by your health care provider.  If you were prescribed an antibiotic medicine, take it as told by your health care provider. Do not stop taking the antibiotic even if you start to feel better. General instructions  Monitor your condition for any changes.  Drink enough fluid to keep your urine clear or pale yellow.  Go to the bathroom more often to keep your bladder empty.  If you are female, keep the area around your vagina and rectum clean. Wipe yourself from front to back after urinating.  Keep all follow-up visits as told by your health care provider. This is important. Contact a health care provider if:  You notice any new symptoms, such as back pain or burning while urinating. Get help right away if:  You develop signs of an infection such as: ? A burning sensation when you urinate. ? Have pain when you urinate. ? Develop an intense need to urinate. ? Urinating more frequently. ? Back pain or pelvic pain. ? Fever or chills.  You have blood in your urine.  Your urine becomes discolored or cloudy.  Your urine smells bad.  You have severe pain that cannot be controlled with medicine. Summary  Asymptomatic bacteriuria is the presence of a large number of bacteria in the urine without the usual symptoms of burning or frequent urination.  Usually, treatment is not needed for this condition. Treating the condition can lead to other problems, such as too much yeast and the growth of antibiotic-resistant bacteria.  Some people, such as pregnant women and people with kidney transplants, do need treatment with antibiotic medicines to prevent   kidney infection (pyelonephritis).  If you were prescribed an antibiotic medicine, take it as told by your health care provider. Do not stop taking the antibiotic even if you start to feel better. This information is not intended to replace advice given to you by your health care provider. Make sure you  discuss any questions you have with your health care provider. Document Released: 03/18/2005 Document Revised: 03/12/2016 Document Reviewed: 03/12/2016 Elsevier Interactive Patient Education  2017 [employer] Inc[employer]  

## 2016-07-17 NOTE — MAU Note (Signed)
Pt C/O contractions every day around the same time for 2 hours - for the past week.  Had watery discharge yesterday, still leaking some, not as much today.  Denies bleeding.  Reports good FM.  Feeling dizzy.

## 2016-07-17 NOTE — MAU Provider Note (Signed)
I confirm that I have verified the information documented in the resident's note and that I have also personally reperformed the physical exam and all medical decision making activities.  Margaret Jones, CNM 07/17/2016 6:00 PM  History     CSN: [id number]  Arrival date and time: 07/17/16 1607   First Provider Initiated Contact with Patient 07/17/16 1649      Chief Complaint  Patient presents with  . Contractions  . Vaginal Discharge   HPI Patient is a G2P1001 @ 34+4 who presents with concerns of vaginal discharge vs thin fluid yesterday. Also with contractions daily for past week that last a few hours, then go away and contraction q39min when they do happen. Feels good FM, no VB, no contractions while on monitor. No dysuria, frequency, urgency.   OB History    Gravida Para Term Preterm AB Living   0 0 1   SAB TAB Ectopic Multiple Live Births   0 0 0 0 1      Past Medical History:  Diagnosis Date  . Anemia   . Gall stones     Past Surgical History:  Procedure Laterality Date  . CESAREAN SECTION    . CHOLECYSTECTOMY    . ERCP N/A 09/21/2015   Procedure: ENDOSCOPIC RETROGRADE CHOLANGIOPANCREATOGRAPHY (ERCP);  Surgeon: Vida Rigger, MD;  Location: [facility];  Service: Endoscopy;  Laterality: N/A;    Family History  Problem Relation Age of Onset  . Asthma Mother   . Heart disease Mother   . Diabetes Father   . Hypertension Father     Social History  Substance Use Topics  . Smoking status: Never Smoker  . Smokeless tobacco: Never Used  . Alcohol use No    Allergies:  Allergies  Allergen Reactions  . Rocephin [Ceftriaxone] Anaphylaxis    Prescriptions Prior to Admission  Medication Sig Dispense Refill Last Dose  . acetaminophen (TYLENOL) 500 MG tablet Take 1,000 mg by mouth every 6 (six) hours as needed for mild pain or headache.   Past Week at Unknown time  . albuterol (PROVENTIL HFA;VENTOLIN HFA) 108 (90 Base) MCG/ACT inhaler Inhale 2 puffs into  the lungs every 6 (six) hours as needed for wheezing or shortness of breath. (Patient not taking: Reported on 06/25/2016) 1 Inhaler 0 Not Taking at Unknown time  . terconazole (TERAZOL 3) 0.8 % vaginal cream Place 1 applicator vaginally at bedtime. (Patient not taking: Reported on 06/25/2016) 20 g 0 Completed Course at Unknown time    Review of Systems Physical Exam   Blood pressure 110/68, pulse (!) 122, temperature 98.2 F (36.8 C), temperature source Oral, resp. rate 18, last menstrual period 11/18/2015.  Physical Exam  Constitutional: She is oriented to person, place, and time. She appears well-developed and well-nourished.  HENT:  Head: Normocephalic.  Cardiovascular:  Slightly tachycardic  Respiratory: Effort normal. No respiratory distress.  GI: Soft. She exhibits no distension.  Genitourinary: Vagina normal.  Genitourinary Comments: Thick mucoid discharge, likely physiologic  Neurological: She is alert and oriented to person, place, and time.  Skin: Skin is warm and dry.  Psychiatric: She has a normal mood and affect. Her behavior is normal. Judgment and thought content normal.   Physical Exam  Constitutional: She is oriented to person, place, and time. She appears well-developed and well-nourished.  HENT:  Head: Normocephalic.  Cardiovascular:  Slightly tachycardic  Pulmonary/Chest: Effort normal. No respiratory distress.  Abdominal: Soft. She exhibits no distension.  Genitourinary: Vagina normal.  Genitourinary  Comments: Thick mucoid discharge, likely physiologic  Neurological: She is alert and oriented to person, place, and time.  Skin: Skin is warm and dry.  Psychiatric: She has a normal mood and affect. Her behavior is normal. Judgment and thought content normal.   MAU Course  Procedures  MDM Abdominal contractions, none on monitor. Category 1 tracing. FHR: 140, mod var, +accels, no decels. No contractions on toco nor per patient report. Sterile spec performed, no  pooling or leakage of fluid with valsalva. Mucoid discharge. Wet prep obtained and negative. UA c/w bacteriuria. Gc/Chlamydia sent. Urine culture sent.  Assessment and Plan  G2P1001 @ 34+4 here with vaginal discharge and daily intermittent contractions.  Bacteriuria in pregnancy -treat with bactrim DS BID x 7 days  Durenda Hurt 07/17/2016, 5:50 PM

## 2016-07-18 LAB — GC/CHLAMYDIA PROBE AMP (~~LOC~~) NOT AT ARMC
Chlamydia: NEGATIVE
NEISSERIA GONORRHEA: NEGATIVE

## 2016-07-19 LAB — CULTURE, OB URINE

## 2016-07-25 ENCOUNTER — Encounter: Payer: Medicaid Other | Admitting: Advanced Practice Midwife

## 2016-07-30 ENCOUNTER — Ambulatory Visit (INDEPENDENT_AMBULATORY_CARE_PROVIDER_SITE_OTHER): Payer: Medicaid Other | Admitting: Obstetrics and Gynecology

## 2016-07-30 VITALS — BP 122/74 | HR 109 | Wt 192.9 lb

## 2016-07-30 DIAGNOSIS — Z3493 Encounter for supervision of normal pregnancy, unspecified, third trimester: Secondary | ICD-10-CM

## 2016-07-30 DIAGNOSIS — Z3490 Encounter for supervision of normal pregnancy, unspecified, unspecified trimester: Secondary | ICD-10-CM

## 2016-07-30 NOTE — Patient Instructions (Signed)

## 2016-07-30 NOTE — Progress Notes (Signed)
   PRENATAL VISIT NOTE  Subjective:  Margaret Jones is a 21 y.o. G2P1001 at [redacted]w[redacted]d being seen today for ongoing prenatal care.  She is currently monitored for the following issues for this low-risk pregnancy and has Supervision of low-risk pregnancy; Previous cesarean section complicating pregnancy, antepartum condition or complication; Limited prenatal care in third trimester; and Anemia in pregnancy, unspecified trimester on her problem list.  Patient reports no complaints.  Contractions: Irritability. Vag. Bleeding: None.  Movement: Present. Denies leaking of fluid.   The following portions of the patient's history were reviewed and updated as appropriate: allergies, current medications, past family history, past medical history, past social history, past surgical history and problem list. Problem list updated.  Objective:   Vitals:   07/30/16 1414  BP: 122/74  Pulse: (!) 109  Weight: 192 lb 14.4 oz (87.5 kg)    Fetal Status: Fetal Heart Rate (bpm): 141   Movement: Present     General:  Alert, oriented and cooperative. Patient is in no acute distress.  Skin: Skin is warm and dry. No rash noted.   Cardiovascular: Normal heart rate noted  Respiratory: Normal respiratory effort, no problems with respiration noted  Abdomen: Soft, gravid, appropriate for gestational age. Pain/Pressure: Present     Pelvic:  Cervical exam deferred        Extremities: Normal range of motion.  Edema: None  Mental Status: Normal mood and affect. Normal behavior. Normal judgment and thought content.   Assessment and Plan:  Pregnancy: G2P1001 at [redacted]w[redacted]d  1. Encounter for supervision of low-risk pregnancy, antepartum -routine pregnancy, no complaints -up to date with testing. -Discussed TOLAC with Patient although not ideal candidate with arrest of dilation patietn wishes to procede - Culture, beta strep (group b only)  Term labor symptoms and general obstetric precautions including but not limited to vaginal  bleeding, contractions, leaking of fluid and fetal movement were reviewed in detail with the patient. Please refer to After Visit Summary for other counseling recommendations.  Return in about 1 week (around 08/06/2016) for LOB.   Lorne Skeens, MD

## 2016-08-02 LAB — CULTURE, BETA STREP (GROUP B ONLY): STREP GP B CULTURE: POSITIVE — AB

## 2016-08-06 ENCOUNTER — Ambulatory Visit (INDEPENDENT_AMBULATORY_CARE_PROVIDER_SITE_OTHER): Payer: Medicaid Other | Admitting: Advanced Practice Midwife

## 2016-08-06 ENCOUNTER — Encounter: Payer: Self-pay | Admitting: Advanced Practice Midwife

## 2016-08-06 VITALS — BP 121/70 | HR 113 | Wt 192.4 lb

## 2016-08-06 DIAGNOSIS — O34219 Maternal care for unspecified type scar from previous cesarean delivery: Secondary | ICD-10-CM

## 2016-08-06 DIAGNOSIS — O99820 Streptococcus B carrier state complicating pregnancy: Secondary | ICD-10-CM

## 2016-08-06 DIAGNOSIS — O0933 Supervision of pregnancy with insufficient antenatal care, third trimester: Secondary | ICD-10-CM

## 2016-08-06 DIAGNOSIS — Z3493 Encounter for supervision of normal pregnancy, unspecified, third trimester: Secondary | ICD-10-CM

## 2016-08-06 NOTE — Progress Notes (Signed)
Breastfeeding tip reviewed 

## 2016-08-06 NOTE — Patient Instructions (Addendum)
Group B Streptococcus Colonization During Pregnancy Group B Streptococcus (GBS) is a type of bacteria (Streptococcus agalactiae) that is often found in healthy people, commonly in the rectum, vagina, and intestines. In people who are healthy and not pregnant, the bacteria rarely cause serious illness or complications. However, women who test positive for GBS during pregnancy can pass the bacteria to their baby during childbirth, which can cause serious infection in the baby after birth. Women with GBS may also have infections during their pregnancy or immediately after childbirth, such as such as urinary tract infections (UTIs) or infections of the uterus (uterine infections). Having GBS also increases a woman's risk of complications during pregnancy, such as early (preterm) labor or delivery, miscarriage, or stillbirth. Routine testing (screening) for GBS is recommended for all pregnant women. What increases the risk? You may have a higher risk for GBS infection during pregnancy if you had one during a past pregnancy. What are the signs or symptoms? In most cases, GBS infection does not cause symptoms in pregnant women. Signs and symptoms of a possible GBS-related infection may include:  Labor starting before the 37th week of pregnancy.  A UTI or bladder infection, which may cause:  Fever.  Pain or burning during urination.  Frequent urination.  Fever during labor, along with:  Bad-smelling discharge.  Uterine tenderness.  Rapid heartbeat in the mother, baby, or both. Rare but serious symptoms of a possible GBS-related infection in women include:  Blood infection (septicemia). This may cause fever, chills, or confusion.  Lung infection (pneumonia). This may cause fever, chills, cough, rapid breathing, difficulty breathing, or chest pain.  Bone, joint, skin, or soft tissue infection. How is this diagnosed? You may be screened for GBS between week 35 and week 37 of your pregnancy. If  you have symptoms of preterm labor, you may be screened earlier. This condition is diagnosed based on lab test results from:  A swab of fluid from the vagina and rectum.  A urine sample. How is this treated? This condition is treated with antibiotic medicine. When you go into labor, or as soon as your water breaks (your membranes rupture), you will be given antibiotics through an IV tube. Antibiotics will continue until after you give birth. If you are having a cesarean delivery, you do not need antibiotics unless your membranes have already ruptured. Follow these instructions at home:  Take over-the-counter and prescription medicines only as told by your health care provider.  Take your antibiotic medicine as told by your health care provider. Do not stop taking the antibiotic even if you start to feel better.  Keep all pre-birth (prenatal) visits and follow-up visits as told by your health care provider. This is important. Contact a health care provider if:  You have pain or burning when you urinate.  You have to urinate frequently.  You have a fever or chills.  You develop a bad-smelling vaginal discharge. Get help right away if:  Your membranes rupture.  You go into labor.  You have severe pain in your abdomen.  You have difficulty breathing.  You have chest pain. This information is not intended to replace advice given to you by your health care provider. Make sure you discuss any questions you have with your health care provider. Document Released: 06/25/2007 Document Revised: 10/13/2015 Document Reviewed: 10/12/2015 Elsevier Interactive Patient Education  2017 [employer].  Trial of Labor After Cesarean Delivery A trial of labor after cesarean delivery (TOLAC) is when a woman tries to give  birth vaginally after a previous cesarean delivery. TOLAC may be a safe and appropriate option for you depending on your medical history and other risk factors. When TOLAC is  successful and you are able to have a vaginal delivery, this is called a vaginal birth after cesarean delivery (VBAC). Candidates for TOLAC TOLAC is possible for some women who:  Have undergone one or two prior cesarean deliveries in which the incision of the uterus was horizontal (low transverse).  Are carrying twins and have had one prior low transverse incision during a cesarean delivery.  Do not have a vertical (classical) uterine scar.  Have not had a tear in the wall of their uterus (uterine rupture). TOLAC is also supported for women who meet appropriate criteria and:  Are under the age of 40 years.  Are tall and have a body mass index (BMI) of less than 30.  Have an unknown uterine scar.  Give birth in a facility equipped to handle an emergency cesarean delivery. This team should be able to handle possible complications such as a uterine rupture.  Have thorough counseling about the benefits and risks of TOLAC.  Have discussed future pregnancy plans with their health care provider.  Plan to have several more pregnancies. Most successful candidates for TOLAC:  Have had a successful vaginal delivery before or after their cesarean delivery.  Experience labor that begins naturally on or before the due date (40 weeks of gestation).  Do not have a very large (macrosomic) baby.  Had a prior cesarean delivery but are not currently experiencing factors that would prompt a cesarean delivery (such as a breech position).  Had only one prior cesarean delivery.  Had a prior cesarean delivery that was performed early in labor and not after full cervical dilation. TOLAC may be most appropriate for women who meet the above guidelines and who plan to have more pregnancies. TOLAC is not recommended for home births. Least successful candidates for TOLAC:  Have an induced labor with an unfavorable cervix. An unfavorable cervix is when the cervix is not dilating enough (among other  factors).  Have never had a vaginal delivery.  Have had more than two cesarean deliveries.  Have a pregnancy at more than 40 weeks of gestation.  Are pregnant with a baby with a suspected weight greater than 4,000 grams (8 pounds) and who have no prior history of a vaginal delivery.  Have closely spaced pregnancies. Suggested benefits of TOLAC  You may have a faster recovery time.  You may have a shorter stay in the hospital.  You may have less pain and fewer problems than with a cesarean delivery. Women who have a cesarean delivery have a higher chance of needing blood or getting a fever, an infection, or a blood clot in the legs. Suggested risks of TOLAC The highest risk of complications happens to women who attempt a TOLAC and fail. A failed TOLAC results in an unplanned cesarean delivery. Risks related to [facility] or repeat cesarean deliveries include:  Blood loss.  Infection.  Blood clot.  Injury to surrounding tissues or organs.  Having to remove the uterus (hysterectomy).  Potential problems with the placenta (such as placenta previa or placenta accreta) in future pregnancies. Although very rare, the main concerns with TOLAC are:  Rupture of the uterine scar from a past cesarean delivery.  Needing an emergency cesarean delivery.  Having a bad outcome for the baby (perinatal morbidity). Where to find more information:  American Congress of Obstetricians and  Gynecologists: www.acog.org  [employer] Nurse-Midwives: www.midwife.org This information is not intended to replace advice given to you by your health care provider. Make sure you discuss any questions you have with your health care provider. Document Released: 12/04/2010 Document Revised: 02/14/2016 Document Reviewed: 09/07/2012 Elsevier Interactive Patient Education  2017 [employer].

## 2016-08-06 NOTE — Progress Notes (Signed)
   PRENATAL VISIT NOTE  Subjective:  Margaret Jones is a 21 y.o. G2P1001 at 5141w3d being seen today for ongoing prenatal care.  She is currently monitored for the following issues for this low-risk pregnancy and has Supervision of low-risk pregnancy; Previous cesarean section complicating pregnancy, antepartum condition or complication; Limited prenatal care in third trimester; Anemia in pregnancy, unspecified trimester; and Group B Streptococcus carrier, antepartum on her problem list.  Patient reports occasional contractions.  Contractions: Irregular. Vag. Bleeding: None.  Movement: Present. Denies leaking of fluid.   The following portions of the patient's history were reviewed and updated as appropriate: allergies, current medications, past family history, past medical history, past social history, past surgical history and problem list. Problem list updated.  Objective:   Vitals:   08/06/16 0828  BP: 121/70  Pulse: (!) 113  Weight: 192 lb 6.4 oz (87.3 kg)    Fetal Status: Fetal Heart Rate (bpm): 143 Fundal Height: 37 cm Movement: Present  Presentation: Vertex  General:  Alert, oriented and cooperative. Patient is in no acute distress.  Skin: Skin is warm and dry. No rash noted.   Cardiovascular: Normal heart rate noted  Respiratory: Normal respiratory effort, no problems with respiration noted  Abdomen: Soft, gravid, appropriate for gestational age. Pain/Pressure: Present     Pelvic:  Cervical exam performed Dilation: 1 Effacement (%): Thick Station: -3  Extremities: Normal range of motion.  Edema: None  Mental Status: Normal mood and affect. Normal behavior. Normal judgment and thought content.   Assessment and Plan:  Pregnancy: G2P1001 at 7441w3d  1. Encounter for supervision of low-risk pregnancy in third trimester Cervical exam performed. Labor precautions discussed.  2. Previous cesarean section complicating pregnancy, antepartum condition or complication Planning TOLAC,  consent signed  3. Limited prenatal care in third trimester  4. Group B Streptococcus carrier, antepartum Reviewed with patient. Able to take "cillins" without reaction.   Term labor symptoms and general obstetric precautions including but not limited to vaginal bleeding, contractions, leaking of fluid and fetal movement were reviewed in detail with the patient. Please refer to After Visit Summary for other counseling recommendations.  Return in about 1 week (around 08/13/2016) for ROB.   Rolm BookbinderNeill, Niambi Smoak M, Student-MidWife

## 2016-08-15 ENCOUNTER — Encounter: Payer: Medicaid Other | Admitting: Obstetrics and Gynecology

## 2016-08-15 ENCOUNTER — Telehealth: Payer: Self-pay | Admitting: Obstetrics and Gynecology

## 2016-08-15 NOTE — Telephone Encounter (Signed)
Attempted to call patient due to missed ob follow up, was unsuccessful in reaching patient. Patient has follow-up scheduled next week.

## 2016-08-16 ENCOUNTER — Inpatient Hospital Stay (HOSPITAL_COMMUNITY)
Admission: AD | Admit: 2016-08-16 | Discharge: 2016-08-16 | Disposition: A | Discharge status: Home / Self Care | Payer: Medicaid Other | Source: Ambulatory Visit | Attending: Obstetrics & Gynecology | Admitting: Obstetrics & Gynecology

## 2016-08-16 ENCOUNTER — Encounter (HOSPITAL_COMMUNITY): Payer: Self-pay | Admitting: *Deleted

## 2016-08-16 DIAGNOSIS — O99019 Anemia complicating pregnancy, unspecified trimester: Secondary | ICD-10-CM

## 2016-08-16 DIAGNOSIS — Z3493 Encounter for supervision of normal pregnancy, unspecified, third trimester: Secondary | ICD-10-CM

## 2016-08-16 DIAGNOSIS — O479 False labor, unspecified: Secondary | ICD-10-CM

## 2016-08-16 DIAGNOSIS — O99820 Streptococcus B carrier state complicating pregnancy: Secondary | ICD-10-CM

## 2016-08-16 NOTE — MAU Note (Signed)
PT  SAYS   [facility]  HURT   BAD   SINCE  [facility]    [facility]   LAST WEEK   1  CM.  GBS- POSITIVE   DENIES HSV AND  MRSA.

## 2016-08-16 NOTE — MAU Note (Signed)
I have communicated with Dr. Natale MilchLancaster and reviewed vital signs:  Vitals:   08/16/16 0312  BP: 110/63  Pulse: 91  Resp: 20  Temp: 98.5 F (36.9 C)    Vaginal exam:  Dilation: 1.5 Effacement (%): 40 Station: -3 Presentation: Vertex Exam by:: Latricia HeftAnna Alvine Mostafa, RN,   Also reviewed contraction pattern and that non-stress test is reactive.  It has been documented that patient is contracting every 4-5 minutes with no cervical change over 1 hour not indicating active labor.  Patient denies any other complaints.  Based on this report provider has given order for discharge.  A discharge order and diagnosis entered by a provider.   Labor discharge instructions reviewed with patient.

## 2016-08-17 ENCOUNTER — Inpatient Hospital Stay (HOSPITAL_COMMUNITY)
Admission: AD | Admit: 2016-08-17 | Discharge: 2016-08-21 | DRG: 766 | Disposition: A | Discharge status: Home / Self Care | Payer: Medicaid Other | Source: Ambulatory Visit | Attending: Obstetrics and Gynecology | Admitting: Obstetrics and Gynecology

## 2016-08-17 ENCOUNTER — Encounter (HOSPITAL_COMMUNITY): Payer: Self-pay

## 2016-08-17 DIAGNOSIS — Z8249 Family history of ischemic heart disease and other diseases of the circulatory system: Secondary | ICD-10-CM

## 2016-08-17 DIAGNOSIS — Z833 Family history of diabetes mellitus: Secondary | ICD-10-CM | POA: Diagnosis not present

## 2016-08-17 DIAGNOSIS — O339 Maternal care for disproportion, unspecified: Secondary | ICD-10-CM | POA: Diagnosis present

## 2016-08-17 DIAGNOSIS — O324XX0 Maternal care for high head at term, not applicable or unspecified: Secondary | ICD-10-CM | POA: Diagnosis present

## 2016-08-17 DIAGNOSIS — Z3A39 39 weeks gestation of pregnancy: Secondary | ICD-10-CM

## 2016-08-17 DIAGNOSIS — O99824 Streptococcus B carrier state complicating childbirth: Secondary | ICD-10-CM | POA: Diagnosis present

## 2016-08-17 DIAGNOSIS — O34211 Maternal care for low transverse scar from previous cesarean delivery: Secondary | ICD-10-CM | POA: Diagnosis present

## 2016-08-17 DIAGNOSIS — O99019 Anemia complicating pregnancy, unspecified trimester: Secondary | ICD-10-CM

## 2016-08-17 DIAGNOSIS — O4202 Full-term premature rupture of membranes, onset of labor within 24 hours of rupture: Principal | ICD-10-CM | POA: Diagnosis present

## 2016-08-17 DIAGNOSIS — Z3493 Encounter for supervision of normal pregnancy, unspecified, third trimester: Secondary | ICD-10-CM

## 2016-08-17 DIAGNOSIS — O4290 Premature rupture of membranes, unspecified as to length of time between rupture and onset of labor, unspecified weeks of gestation: Secondary | ICD-10-CM | POA: Diagnosis present

## 2016-08-17 DIAGNOSIS — O99820 Streptococcus B carrier state complicating pregnancy: Secondary | ICD-10-CM

## 2016-08-17 LAB — CBC
HEMATOCRIT: 29.7 % — AB (ref 36.0–46.0)
HEMOGLOBIN: 9.1 g/dL — AB (ref 12.0–15.0)
MCH: 22 pg — ABNORMAL LOW (ref 26.0–34.0)
MCHC: 30.6 g/dL (ref 30.0–36.0)
MCV: 71.7 fL — ABNORMAL LOW (ref 78.0–100.0)
Platelets: 338 10*3/uL (ref 150–400)
RBC: 4.14 MIL/uL (ref 3.87–5.11)
RDW: 16.2 % — ABNORMAL HIGH (ref 11.5–15.5)
WBC: 9.2 10*3/uL (ref 4.0–10.5)

## 2016-08-17 LAB — TYPE AND SCREEN
ABO/RH(D): B POS
Antibody Screen: NEGATIVE

## 2016-08-17 LAB — POCT FERN TEST

## 2016-08-17 MED ORDER — OXYCODONE-ACETAMINOPHEN 5-325 MG PO TABS: 2.0000 | ORAL_TABLET | ORAL | Status: DC | PRN

## 2016-08-17 MED ORDER — PENICILLIN G POT IN DEXTROSE 60000 UNIT/ML IV SOLN
3.0000 10*6.[IU] | INTRAVENOUS | Status: DC
  Administered 2016-08-18 (×3): 3 10*6.[IU] via INTRAVENOUS
  Filled 2016-08-17 (×6): qty 50

## 2016-08-17 MED ORDER — FENTANYL CITRATE (PF) 100 MCG/2ML IJ SOLN
100.0000 ug | INTRAMUSCULAR | Status: DC | PRN
  Administered 2016-08-18: 100 ug via INTRAVENOUS
  Filled 2016-08-17: qty 2

## 2016-08-17 MED ORDER — CALCIUM CARBONATE ANTACID 500 MG PO CHEW
1.0000 | CHEWABLE_TABLET | Freq: Every day | ORAL | Status: DC | PRN
  Administered 2016-08-17: 200 mg via ORAL
  Filled 2016-08-17: qty 1

## 2016-08-17 MED ORDER — LIDOCAINE HCL (PF) 1 % IJ SOLN
30.0000 mL | INTRAMUSCULAR | Status: DC | PRN
  Filled 2016-08-17: qty 30

## 2016-08-17 MED ORDER — LACTATED RINGERS IV SOLN: 500.0000 mL | INTRAVENOUS | Status: DC | PRN

## 2016-08-17 MED ORDER — LACTATED RINGERS IV SOLN
INTRAVENOUS | Status: DC
  Administered 2016-08-18 (×2): via INTRAVENOUS

## 2016-08-17 MED ORDER — SOD CITRATE-CITRIC ACID 500-334 MG/5ML PO SOLN
30.0000 mL | ORAL | Status: DC | PRN
  Administered 2016-08-18: 30 mL via ORAL
  Filled 2016-08-17: qty 15

## 2016-08-17 MED ORDER — VANCOMYCIN HCL IN DEXTROSE 1-5 GM/200ML-% IV SOLN
1000.0000 mg | Freq: Two times a day (BID) | INTRAVENOUS | Status: DC
  Filled 2016-08-17: qty 200

## 2016-08-17 MED ORDER — ACETAMINOPHEN 325 MG PO TABS
650.0000 mg | ORAL_TABLET | ORAL | Status: DC | PRN
  Administered 2016-08-18: 650 mg via ORAL
  Filled 2016-08-17: qty 2

## 2016-08-17 MED ORDER — OXYTOCIN BOLUS FROM INFUSION: 500.0000 mL | Freq: Once | INTRAVENOUS | Status: DC

## 2016-08-17 MED ORDER — OXYCODONE-ACETAMINOPHEN 5-325 MG PO TABS: 1.0000 | ORAL_TABLET | ORAL | Status: DC | PRN

## 2016-08-17 MED ORDER — ONDANSETRON HCL 4 MG/2ML IJ SOLN
4.0000 mg | Freq: Four times a day (QID) | INTRAMUSCULAR | Status: DC | PRN
Start: 2016-08-17 — End: 2016-08-18

## 2016-08-17 MED ORDER — OXYTOCIN 40 UNITS IN LACTATED RINGERS INFUSION - SIMPLE MED: 2.5000 [IU]/h | INTRAVENOUS | Status: DC

## 2016-08-17 MED ORDER — DEXTROSE 5 % IV SOLN
5.0000 10*6.[IU] | Freq: Once | INTRAVENOUS | Status: AC
  Administered 2016-08-17: 5 10*6.[IU] via INTRAVENOUS
  Filled 2016-08-17: qty 5

## 2016-08-17 NOTE — Progress Notes (Addendum)
G2P1 @ [redacted] wksga. Presents to triage for r/o Srom @ 1800 clear. Ferning done. SVE 1.5/50/-3. Copious amount of clear fluid noted on glove. + FM. EFM applied. see flow sheet for VS details  1953: Provider notified. Report status of pt given. Pt to be admitted. Provider stated will put in orders .  2004: IV inserted and labs drawn.   2007: pt to birthing via wheelchair.

## 2016-08-17 NOTE — H&P (Signed)
Margaret Jones is a 21 y.o. female G2P1001 at 339 weeks presenting for spontaneous rupture of membranes. She reports a gush of clear fluid at 1800. She denies bleeding. Reports good fetal movement. She has some cramping but no regular contractions. Hx of a c-section for arrest of dilation at 8cm. Planning TOLAC.  OB History    Gravida Para Term Preterm AB Living   2 1 1  0 0 1   SAB TAB Ectopic Multiple Live Births   0 0 0 0 1     Past Medical History:  Diagnosis Date  . Anemia   . Gall stones    Past Surgical History:  Procedure Laterality Date  . CESAREAN SECTION    . CHOLECYSTECTOMY    . ERCP N/A 09/21/2015   Procedure: ENDOSCOPIC RETROGRADE CHOLANGIOPANCREATOGRAPHY (ERCP);  Surgeon: Vida RiggerMarc Magod, MD;  Location: [facility];  Service: Endoscopy;  Laterality: N/A;   Family History: family history includes Asthma in her mother; Diabetes in her father; Heart disease in her mother; Hypertension in her father. Social History:  reports that she has never smoked. She has never used smokeless tobacco. She reports that she does not drink alcohol or use drugs.     Maternal Diabetes: No Genetic Screening: Normal Maternal Ultrasounds/Referrals: Normal Fetal Ultrasounds or other Referrals:  None Maternal Substance Abuse:  No Significant Maternal Medications:  None Significant Maternal Lab Results:  None Other Comments:  None  Review of Systems  Constitutional: Negative.   Respiratory: Negative.  Negative for shortness of breath.   Cardiovascular: Negative.  Negative for chest pain.  Gastrointestinal: Positive for abdominal pain.  Neurological: Negative.    Maternal Medical History:  Reason for admission: Rupture of membranes.   Contractions: Frequency: rare.   Perceived severity is mild.    Fetal activity: Perceived fetal activity is normal.   Last perceived fetal movement was within the past hour.    Prenatal complications: no prenatal complications Prenatal Complications -  Diabetes: none.    Dilation: 1.5 Effacement (%): 50 Station: -3 Exam by:: Naomie DeanJaneen McClellan, RN Blood pressure 109/67, pulse 84, temperature 98.6 F (37 C), temperature source Oral, resp. rate 20, height 5\' 2"  (1.575 m), weight 194 lb (88 kg), last menstrual period 11/18/2015. Maternal Exam:  Uterine Assessment: Contraction strength is mild.  Contraction frequency is rare.   Abdomen: Patient reports no abdominal tenderness. Fetal presentation: vertex  Introitus: Normal vulva. Normal vagina.  Ferning test: positive.  Nitrazine test: not done. Amniotic fluid character: clear.  Cervix: Cervix evaluated by digital exam.     Fetal Exam Fetal Monitor Review: Mode: ultrasound.   Baseline rate: 130.  Variability: moderate (6-25 bpm).   Pattern: accelerations present and no decelerations.    Fetal State Assessment: Category I - tracings are normal.     Physical Exam  Vitals reviewed. Constitutional: She appears well-developed and well-nourished.  HENT:  Head: Normocephalic and atraumatic.  Eyes: Conjunctivae are normal. No scleral icterus.  Cardiovascular: Normal rate and regular rhythm.   Respiratory: Effort normal and breath sounds normal. No respiratory distress.  GI: Soft. There is no tenderness.  Musculoskeletal: Normal range of motion.  Neurological: She is alert.  Skin: Skin is warm and dry.  Psychiatric: She has a normal mood and affect. Her behavior is normal. Judgment and thought content normal.    Prenatal labs: ABO, Rh: --/--/B POS (05/19 1958) Antibody: NEG (05/19 1958) Rubella: 2.91 (12/21 1359) RPR: Non Reactive (04/11 1132)  HBsAg: NEGATIVE (12/21 1359)  HIV: Non Reactive (  04/11 1132)  GBS:   Positive  Assessment/Plan: IUP at term. SROM. GBS pos. TOLAC  Attempt foley bulb for cervical ripening. Plan to start pitocin when foley falls out. Epidural for pain management.  PCN for GBS prophylaxis, had PCN with last pregnancy without reaction.  BC: unsure,  counseled on options   Cleone [facility] 08/17/2016, 10:13 PM  I was present for the exam and agree with above.  Katrinka Blazing, [place] 08/17/2016 11:38 PM

## 2016-08-17 NOTE — MAU Note (Addendum)
Leaking clear fld since 1830. Occ ctx. Baby not moving as much today until water broke and has moved more like normal. For TOLAC

## 2016-08-18 ENCOUNTER — Encounter (HOSPITAL_COMMUNITY): Payer: Self-pay | Admitting: *Deleted

## 2016-08-18 ENCOUNTER — Inpatient Hospital Stay (HOSPITAL_COMMUNITY): Payer: Medicaid Other | Admitting: Anesthesiology

## 2016-08-18 ENCOUNTER — Encounter (HOSPITAL_COMMUNITY)
Admission: AD | Disposition: A | Discharge status: Home / Self Care | Payer: Self-pay | Source: Ambulatory Visit | Attending: Obstetrics and Gynecology

## 2016-08-18 DIAGNOSIS — O99824 Streptococcus B carrier state complicating childbirth: Secondary | ICD-10-CM

## 2016-08-18 DIAGNOSIS — O4290 Premature rupture of membranes, unspecified as to length of time between rupture and onset of labor, unspecified weeks of gestation: Secondary | ICD-10-CM | POA: Diagnosis present

## 2016-08-18 DIAGNOSIS — O34211 Maternal care for low transverse scar from previous cesarean delivery: Secondary | ICD-10-CM

## 2016-08-18 DIAGNOSIS — Z3A39 39 weeks gestation of pregnancy: Secondary | ICD-10-CM

## 2016-08-18 LAB — RPR: RPR Ser Ql: NONREACTIVE

## 2016-08-18 SURGERY — Surgical Case
Anesthesia: Regional

## 2016-08-18 MED ORDER — SIMETHICONE 80 MG PO CHEW
80.0000 mg | CHEWABLE_TABLET | Freq: Three times a day (TID) | ORAL | Status: DC
Start: 2016-08-18 — End: 2016-08-21
  Administered 2016-08-19 – 2016-08-21 (×5): 80 mg via ORAL
  Filled 2016-08-18 (×7): qty 1

## 2016-08-18 MED ORDER — SCOPOLAMINE 1 MG/3DAYS TD PT72
MEDICATED_PATCH | TRANSDERMAL | Status: AC
  Filled 2016-08-18: qty 1

## 2016-08-18 MED ORDER — ACETAMINOPHEN 325 MG PO TABS: 650.0000 mg | ORAL_TABLET | ORAL | Status: DC | PRN

## 2016-08-18 MED ORDER — DEXAMETHASONE SODIUM PHOSPHATE 10 MG/ML IJ SOLN
INTRAMUSCULAR | Status: DC | PRN
  Administered 2016-08-18: 8 mg via INTRAVENOUS

## 2016-08-18 MED ORDER — CEFAZOLIN SODIUM-DEXTROSE 2-3 GM-% IV SOLR
INTRAVENOUS | Status: DC | PRN
Start: 2016-08-18 — End: 2016-08-18
  Administered 2016-08-18: 2 g via INTRAVENOUS

## 2016-08-18 MED ORDER — SCOPOLAMINE 1 MG/3DAYS TD PT72
MEDICATED_PATCH | TRANSDERMAL | Status: DC | PRN
  Administered 2016-08-18: 1 via TRANSDERMAL

## 2016-08-18 MED ORDER — OXYCODONE-ACETAMINOPHEN 5-325 MG PO TABS
2.0000 | ORAL_TABLET | ORAL | Status: DC | PRN
  Administered 2016-08-20 – 2016-08-21 (×4): 2 via ORAL
  Filled 2016-08-18 (×4): qty 2

## 2016-08-18 MED ORDER — SODIUM CHLORIDE 0.9% FLUSH: 3.0000 mL | INTRAVENOUS | Status: DC | PRN

## 2016-08-18 MED ORDER — SENNOSIDES-DOCUSATE SODIUM 8.6-50 MG PO TABS
2.0000 | ORAL_TABLET | ORAL | Status: DC
  Administered 2016-08-19 – 2016-08-20 (×3): 2 via ORAL
  Filled 2016-08-18 (×3): qty 2

## 2016-08-18 MED ORDER — NALOXONE HCL 0.4 MG/ML IJ SOLN: 0.4000 mg | INTRAMUSCULAR | Status: DC | PRN

## 2016-08-18 MED ORDER — METHYLERGONOVINE MALEATE 0.2 MG/ML IJ SOLN: 0.2000 mg | INTRAMUSCULAR | Status: DC | PRN

## 2016-08-18 MED ORDER — ZOLPIDEM TARTRATE 5 MG PO TABS: 5.0000 mg | ORAL_TABLET | Freq: Every evening | ORAL | Status: DC | PRN

## 2016-08-18 MED ORDER — OXYTOCIN 40 UNITS IN LACTATED RINGERS INFUSION - SIMPLE MED
1.0000 m[IU]/min | INTRAVENOUS | Status: DC
  Administered 2016-08-18: 2 m[IU]/min via INTRAVENOUS
  Filled 2016-08-18: qty 1000

## 2016-08-18 MED ORDER — MORPHINE SULFATE (PF) 0.5 MG/ML IJ SOLN
INTRAMUSCULAR | Status: DC | PRN
Start: 2016-08-18 — End: 2016-08-18
  Administered 2016-08-18: 3 mg via EPIDURAL

## 2016-08-18 MED ORDER — FENTANYL 2.5 MCG/ML BUPIVACAINE 1/10 % EPIDURAL INFUSION (WH - ANES)
14.0000 mL/h | INTRAMUSCULAR | Status: DC | PRN
  Administered 2016-08-18 (×2): 14 mL/h via EPIDURAL
  Filled 2016-08-18 (×2): qty 100

## 2016-08-18 MED ORDER — OXYTOCIN 10 UNIT/ML IJ SOLN
INTRAMUSCULAR | Status: AC
  Filled 2016-08-18: qty 4

## 2016-08-18 MED ORDER — DEXAMETHASONE SODIUM PHOSPHATE 10 MG/ML IJ SOLN
INTRAMUSCULAR | Status: AC
  Filled 2016-08-18: qty 1

## 2016-08-18 MED ORDER — LACTATED RINGERS IV SOLN: INTRAVENOUS | Status: DC

## 2016-08-18 MED ORDER — SIMETHICONE 80 MG PO CHEW: 80.0000 mg | CHEWABLE_TABLET | ORAL | Status: DC | PRN

## 2016-08-18 MED ORDER — EPHEDRINE 5 MG/ML INJ: 10.0000 mg | INTRAVENOUS | Status: DC | PRN

## 2016-08-18 MED ORDER — PHENYLEPHRINE 40 MCG/ML (10ML) SYRINGE FOR IV PUSH (FOR BLOOD PRESSURE SUPPORT)
80.0000 ug | PREFILLED_SYRINGE | INTRAVENOUS | Status: DC | PRN
  Administered 2016-08-18: 80 ug via INTRAVENOUS

## 2016-08-18 MED ORDER — DIBUCAINE 1 % RE OINT: 1.0000 "application " | TOPICAL_OINTMENT | RECTAL | Status: DC | PRN

## 2016-08-18 MED ORDER — ACETAMINOPHEN 500 MG PO TABS
1000.0000 mg | ORAL_TABLET | Freq: Four times a day (QID) | ORAL | Status: AC
  Administered 2016-08-19 (×3): 1000 mg via ORAL
  Filled 2016-08-18 (×3): qty 2

## 2016-08-18 MED ORDER — NALBUPHINE HCL 10 MG/ML IJ SOLN: 5.0000 mg | Freq: Once | INTRAMUSCULAR | Status: DC | PRN

## 2016-08-18 MED ORDER — NALOXONE HCL 2 MG/2ML IJ SOSY
1.0000 ug/kg/h | PREFILLED_SYRINGE | INTRAVENOUS | Status: DC | PRN
  Filled 2016-08-18: qty 2

## 2016-08-18 MED ORDER — NALBUPHINE HCL 10 MG/ML IJ SOLN: 5.0000 mg | INTRAMUSCULAR | Status: DC | PRN

## 2016-08-18 MED ORDER — METHYLERGONOVINE MALEATE 0.2 MG PO TABS: 0.2000 mg | ORAL_TABLET | ORAL | Status: DC | PRN

## 2016-08-18 MED ORDER — CEFAZOLIN SODIUM-DEXTROSE 2-4 GM/100ML-% IV SOLN: 2.0000 g | Freq: Once | INTRAVENOUS | Status: DC

## 2016-08-18 MED ORDER — IBUPROFEN 600 MG PO TABS
600.0000 mg | ORAL_TABLET | Freq: Four times a day (QID) | ORAL | Status: DC
  Administered 2016-08-19 – 2016-08-21 (×11): 600 mg via ORAL
  Filled 2016-08-18 (×11): qty 1

## 2016-08-18 MED ORDER — MORPHINE SULFATE (PF) 0.5 MG/ML IJ SOLN
INTRAMUSCULAR | Status: AC
  Filled 2016-08-18: qty 10

## 2016-08-18 MED ORDER — HYDROMORPHONE HCL 1 MG/ML IJ SOLN: 0.2500 mg | INTRAMUSCULAR | Status: DC | PRN

## 2016-08-18 MED ORDER — LACTATED RINGERS IV SOLN: 500.0000 mL | Freq: Once | INTRAVENOUS | Status: DC

## 2016-08-18 MED ORDER — ONDANSETRON HCL 4 MG/2ML IJ SOLN
INTRAMUSCULAR | Status: DC | PRN
  Administered 2016-08-18: 4 mg via INTRAVENOUS

## 2016-08-18 MED ORDER — DIPHENHYDRAMINE HCL 50 MG/ML IJ SOLN: 12.5000 mg | INTRAMUSCULAR | Status: DC | PRN

## 2016-08-18 MED ORDER — SCOPOLAMINE 1 MG/3DAYS TD PT72: 1.0000 | MEDICATED_PATCH | Freq: Once | TRANSDERMAL | Status: DC

## 2016-08-18 MED ORDER — LACTATED RINGERS IV SOLN
INTRAVENOUS | Status: DC | PRN
  Administered 2016-08-18: 14:00:00 via INTRAVENOUS

## 2016-08-18 MED ORDER — MEPERIDINE HCL 25 MG/ML IJ SOLN: 6.2500 mg | INTRAMUSCULAR | Status: DC | PRN

## 2016-08-18 MED ORDER — COCONUT OIL OIL
1.0000 "application " | TOPICAL_OIL | Status: DC | PRN
  Administered 2016-08-20: 1 via TOPICAL
  Filled 2016-08-18: qty 120

## 2016-08-18 MED ORDER — ONDANSETRON HCL 4 MG/2ML IJ SOLN: 4.0000 mg | Freq: Three times a day (TID) | INTRAMUSCULAR | Status: DC | PRN

## 2016-08-18 MED ORDER — ONDANSETRON HCL 4 MG/2ML IJ SOLN
INTRAMUSCULAR | Status: AC
  Filled 2016-08-18: qty 2

## 2016-08-18 MED ORDER — WITCH HAZEL-GLYCERIN EX PADS: 1.0000 "application " | MEDICATED_PAD | CUTANEOUS | Status: DC | PRN

## 2016-08-18 MED ORDER — OXYCODONE-ACETAMINOPHEN 5-325 MG PO TABS
1.0000 | ORAL_TABLET | ORAL | Status: DC | PRN
  Administered 2016-08-20 (×2): 1 via ORAL
  Filled 2016-08-18 (×2): qty 1

## 2016-08-18 MED ORDER — OXYTOCIN 10 UNIT/ML IJ SOLN
INTRAVENOUS | Status: DC | PRN
  Administered 2016-08-18: 40 [IU] via INTRAVENOUS

## 2016-08-18 MED ORDER — LACTATED RINGERS IV SOLN
INTRAVENOUS | Status: DC | PRN
  Administered 2016-08-18: 15:00:00 via INTRAVENOUS

## 2016-08-18 MED ORDER — PHENYLEPHRINE 40 MCG/ML (10ML) SYRINGE FOR IV PUSH (FOR BLOOD PRESSURE SUPPORT)
80.0000 ug | PREFILLED_SYRINGE | INTRAVENOUS | Status: DC | PRN
  Filled 2016-08-18 (×2): qty 10

## 2016-08-18 MED ORDER — DIPHENHYDRAMINE HCL 25 MG PO CAPS: 25.0000 mg | ORAL_CAPSULE | Freq: Four times a day (QID) | ORAL | Status: DC | PRN

## 2016-08-18 MED ORDER — SIMETHICONE 80 MG PO CHEW
80.0000 mg | CHEWABLE_TABLET | ORAL | Status: DC
  Administered 2016-08-19 – 2016-08-20 (×3): 80 mg via ORAL
  Filled 2016-08-18 (×3): qty 1

## 2016-08-18 MED ORDER — SODIUM CHLORIDE 0.9 % IR SOLN
Status: DC | PRN
  Administered 2016-08-18 (×2): 1

## 2016-08-18 MED ORDER — MEPERIDINE HCL 25 MG/ML IJ SOLN
INTRAMUSCULAR | Status: AC
  Filled 2016-08-18: qty 1

## 2016-08-18 MED ORDER — DIPHENHYDRAMINE HCL 25 MG PO CAPS
25.0000 mg | ORAL_CAPSULE | ORAL | Status: DC | PRN
  Filled 2016-08-18: qty 1

## 2016-08-18 MED ORDER — MENTHOL 3 MG MT LOZG: 1.0000 | LOZENGE | OROMUCOSAL | Status: DC | PRN

## 2016-08-18 MED ORDER — OXYTOCIN 40 UNITS IN LACTATED RINGERS INFUSION - SIMPLE MED: 2.5000 [IU]/h | INTRAVENOUS | Status: AC

## 2016-08-18 MED ORDER — TETANUS-DIPHTH-ACELL PERTUSSIS 5-2.5-18.5 LF-MCG/0.5 IM SUSP: 0.5000 mL | Freq: Once | INTRAMUSCULAR | Status: DC

## 2016-08-18 MED ORDER — TERBUTALINE SULFATE 1 MG/ML IJ SOLN: 0.2500 mg | Freq: Once | INTRAMUSCULAR | Status: DC | PRN

## 2016-08-18 MED ORDER — MEPERIDINE HCL 25 MG/ML IJ SOLN
INTRAMUSCULAR | Status: DC | PRN
  Administered 2016-08-18 (×2): 12.5 mg via INTRAVENOUS

## 2016-08-18 MED ORDER — LIDOCAINE HCL (PF) 1 % IJ SOLN
INTRAMUSCULAR | Status: DC | PRN
  Administered 2016-08-18 (×2): 5 mL

## 2016-08-18 MED ORDER — PRENATAL MULTIVITAMIN CH
1.0000 | ORAL_TABLET | Freq: Every day | ORAL | Status: DC
Start: 2016-08-19 — End: 2016-08-21
  Administered 2016-08-19 – 2016-08-21 (×3): 1 via ORAL
  Filled 2016-08-18 (×3): qty 1

## 2016-08-18 MED ORDER — SODIUM BICARBONATE 8.4 % IV SOLN
INTRAVENOUS | Status: DC | PRN
  Administered 2016-08-18: 2 mL via EPIDURAL
  Administered 2016-08-18: 3 mL via EPIDURAL
  Administered 2016-08-18: 2 mL via EPIDURAL
  Administered 2016-08-18: 5 mL via EPIDURAL
  Administered 2016-08-18: 3 mL via EPIDURAL
  Administered 2016-08-18: 5 mL via EPIDURAL

## 2016-08-18 SURGICAL SUPPLY — 36 items

## 2016-08-18 NOTE — Progress Notes (Signed)
Subjective: Patient comfortable and reporting occasional cramping.   Objective: Vitals:   08/17/16 1914 08/17/16 2030 08/18/16 0003  BP: 124/66 109/67 (!) 106/59  Pulse: 90 84 97  Resp: 20 20 18   Temp: 98.2 F (36.8 C) 98.6 F (37 C) 98.4 F (36.9 C)  TempSrc:  Oral   Weight: 194 lb (88 kg) 194 lb (88 kg)   Height: 5\' 2"  (1.575 m) 5\' 2"  (1.575 m)    FHT:  FHR: 135 bpm, variability: moderate,  accelerations:  Present,  decelerations:  Absent UC:   irregular, every 7-10 minutes SVE:   Dilation: 1.5 Effacement (%): 50 Station: -3 Exam by:: Sade Harper,RN  Labs: Lab Results  Component Value Date   WBC 9.2 08/17/2016   HGB 9.1 (L) 08/17/2016   HCT 29.7 (L) 08/17/2016   MCV 71.7 (L) 08/17/2016   PLT 338 08/17/2016    Assessment / Plan: IUP at term. PROM. GBS pos. TOLAC  Cervix unchanged over 5 hours. Discussed IOL due to SROM and GBS pos. Patient agreeable to plan. Foley bulb placed for cervical ripening.   Plan: Wait for foley to fall out and to start pitocin after. Regular diet until pitocin started  Margaret Jones SNM 08/18/2016, 12:16 AM

## 2016-08-18 NOTE — Transfer of Care (Signed)
Immediate Anesthesia Transfer of Care Note  Patient: Margaret Jones  Procedure(s) Performed: Procedure(s): CESAREAN SECTION (N/A)  Patient Location: PACU  Anesthesia Type:Epidural  Level of Consciousness: awake and alert   Airway & Oxygen Therapy: Patient Spontanous Breathing  Post-op Assessment: Report given to RN and Post -op Vital signs reviewed and stable  Post vital signs: Reviewed  Last Vitals:  Vitals:   08/18/16 1405 08/18/16 1556  BP: 118/69   Pulse: (!) 117   Resp: 18   Temp:  36.9 C    Last Pain:  Vitals:   08/18/16 1556  TempSrc: Oral  PainSc:       Patients Stated Pain Goal: 4 (08/17/16 1944)  Complications: No apparent anesthesia complications

## 2016-08-18 NOTE — Progress Notes (Signed)
Margaret Jones is a 21 y.o. G2P1001 at 6880w1d by ultrasound admitted for rupture of membranes  Subjective:   Objective: BP 118/69   Pulse (!) 117   Temp 98.2 F (36.8 C) (Oral)   Resp 18   Ht 5\' 2"  (1.575 m)   Wt 194 lb (88 kg)   LMP 11/18/2015   SpO2 100%   BMI 35.48 kg/m  No intake/output data recorded. No intake/output data recorded.  FHT:  FHR: 130 bpm, variability: moderate,  accelerations:  Present,  decelerations:  Present early UC:   regular, every 2-4 minutes SVE:   Dilation: 9 Effacement (%): 90 Station: 0, +1 Exam by:: Azzie GlatterStephanie Faulk,RN  Labs: Lab Results  Component Value Date   WBC 9.2 08/17/2016   HGB 9.1 (L) 08/17/2016   HCT 29.7 (L) 08/17/2016   MCV 71.7 (L) 08/17/2016   PLT 338 08/17/2016    Assessment / Plan: Protracted active phase  Labor: slow pregress Dr. Despina HiddenEure consulted Preeclampsia:  no signs or symptoms of toxicity Fetal Wellbeing:  Category II Pain Control:  Epidural I/D:  n/a Anticipated MOD:  NSVD  Wyvonnia DuskyMarie Lawson 08/18/2016, 2:23 PM

## 2016-08-18 NOTE — Progress Notes (Signed)
Margaret Jones is a 21 y.o. G2P1001 at 7175w1d by ultrasound admitted for rupture of membranes  Subjective:   Objective: BP (!) 107/56   Pulse 100   Temp 97.9 F (36.6 C) (Oral)   Resp 20   Ht 5\' 2"  (1.575 m)   Wt 194 lb (88 kg)   LMP 11/18/2015   SpO2 100%   BMI 35.48 kg/m  No intake/output data recorded. No intake/output data recorded.  FHT:  FHR: 120's bpm, variability: moderate,  accelerations:  Present,  decelerations:  Absent UC:   regular, every 2 minutes SVE:   Dilation: 9 Effacement (%): 90 Station: 0, +1 Exam by:: Henderson NewcomerStephanie Faulk, RN  Labs: Lab Results  Component Value Date   WBC 9.2 08/17/2016   HGB 9.1 (L) 08/17/2016   HCT 29.7 (L) 08/17/2016   MCV 71.7 (L) 08/17/2016   PLT 338 08/17/2016    Assessment / Plan: Augmentation of labor, progressing well  Labor: Progressing normally Preeclampsia:  no signs or symptoms of toxicity Fetal Wellbeing:  Category I Pain Control:  Epidural I/D:  n/a Anticipated MOD:  NSVD  Wyvonnia DuskyMarie Lawson 08/18/2016, 9:54 AM

## 2016-08-18 NOTE — Anesthesia Postprocedure Evaluation (Signed)
Anesthesia Post Note  Patient: Margaret Jones  Procedure(s) Performed: Procedure(s) (LRB): CESAREAN SECTION (N/A)  Patient location during evaluation: PACU Anesthesia Type: Epidural Level of consciousness: awake Pain management: satisfactory to patient Vital Signs Assessment: post-procedure vital signs reviewed and stable Respiratory status: spontaneous breathing Cardiovascular status: blood pressure returned to baseline Postop Assessment: no headache and spinal receding Anesthetic complications: no        Last Vitals:  Vitals:   08/18/16 1930 08/18/16 2030  BP: (!) 114/57 117/77  Pulse: 84 94  Resp: 16 18  Temp: 36.8 C 36.8 C    Last Pain:  Vitals:   08/18/16 2030  TempSrc: Oral  PainSc: 3    Pain Goal: Patients Stated Pain Goal: 4 (08/17/16 1944)               Jiles GarterJACKSON,Arleigh Dicola EDWARD

## 2016-08-18 NOTE — Progress Notes (Signed)
Patient ID: Margaret Jones, female   DOB: 1995/09/21, 21 y.o.   MRN: [id number] Margaret Jones is a 21 y.o. G2P1001 at 3047w1d.  Subjective: Severe discomfort w/ UC's.   Objective: BP (!) 124/94   Pulse (!) 144   Temp 98.4 F (36.9 C) (Oral)   Resp 20   Ht 5\' 2"  (1.575 m)   Wt 194 lb (88 kg)   LMP 11/18/2015   BMI 35.48 kg/m    FHT:  FHR: 125 bpm, variability: mod,  accelerations:  15x15,  decelerations:  none UC:   Q 2-4 minutes, strong Dilation: 7.5 Effacement (%): 90 Cervical Position: Middle Station: 0 Presentation: Vertex Exam by:: Verdell CarmineS. Harper, RN  Labs: NA  Assessment / Plan: 6747w1d week IUP Labor: transition Fetal Wellbeing:  Category I Pain Control:  Requesting epidural Anticipated MOD:  SVD  Dorathy KinsmanSmith, Dracen Reigle, CNM 08/18/2016 6:50 AM

## 2016-08-18 NOTE — Anesthesia Procedure Notes (Signed)
Epidural Patient location during procedure: OB  Staffing Anesthesiologist: Jermika Olden Performed: anesthesiologist   Preanesthetic Checklist Completed: patient identified, site marked, surgical consent, pre-op evaluation, timeout performed, IV checked, risks and benefits discussed and monitors and equipment checked  Epidural Patient position: sitting Prep: DuraPrep Patient monitoring: heart rate, continuous pulse ox and blood pressure Approach: right paramedian Location: L3-L4 Injection technique: LOR saline  Needle:  Needle type: Tuohy  Needle gauge: 17 G Needle length: 9 cm and 9 Needle insertion depth: 6 cm Catheter type: closed end flexible Catheter size: 20 Guage Catheter at skin depth: 11 cm Test dose: negative  Assessment Events: blood not aspirated, injection not painful, no injection resistance, negative IV test and no paresthesia  Additional Notes Patient identified. Risks/Benefits/Options discussed with patient including but not limited to bleeding, infection, nerve damage, paralysis, failed block, incomplete pain control, headache, blood pressure changes, nausea, vomiting, reactions to medication both or allergic, itching and postpartum back pain. Confirmed with bedside nurse the patient's most recent platelet count. Confirmed with patient that they are not currently taking any anticoagulation, have any bleeding history or any family history of bleeding disorders. Patient expressed understanding and wished to proceed. All questions were answered. Sterile technique was used throughout the entire procedure. Please see nursing notes for vital signs. Test dose was given through epidural needle and negative prior to continuing to dose epidural or start infusion. Warning signs of high block given to the patient including shortness of breath, tingling/numbness in hands, complete motor block, or any concerning symptoms with instructions to call for help. Patient was given  instructions on fall risk and not to get out of bed. All questions and concerns addressed with instructions to call with any issues.     

## 2016-08-18 NOTE — Progress Notes (Signed)
Margaret Jones is a 21 y.o. G2P1001 at 6973w1d.  Subjective: Very uncomfortable w/ UC's  Objective: BP 115/71   Pulse 87   Temp 98 F (36.7 C)   Resp 18   Ht 5\' 2"  (1.575 m)   Wt 194 lb (88 kg)   LMP 11/18/2015   BMI 35.48 kg/m    FHT:  FHR: 130 bpm, variability: mod,  accelerations:  15x15,  decelerations:  none UC:   Q 2-3 minutes, moderate Dilation: 6.5 Effacement (%): 60 Cervical Position: Middle Station: -1 Presentation: Vertex Exam by:: Gerren Hoffmeier CNM  Labs: NA  Assessment / Plan: 6073w1d week IUP Labor: active Fetal Wellbeing:  Category I Pain Control:  Comfort measures Anticipated MOD:  SVD Pitocin PRN if UC's space out. Consider IUPC.  Katrinka BlazingSmith, [place], CNM 08/18/2016 4:07 AM

## 2016-08-18 NOTE — Op Note (Signed)
Preoperative diagnosis:  1.  Intrauterine pregnancy at 4110w1d  weeks gestation                                         2.  Secondary arrest of descent and dilatation in the active phase of labor                                         3.  Asynclitism                                         4.  Previous Caesarean section   Postoperative diagnosis:  Same as above   Procedure:  Repeat cesarean section  Surgeon:  Lazaro ArmsLuther H Muath Hallam MD  Assistant:    Anesthesia: epidural  Findings:  Pt progressed to 9 cm by this am at 0800.   However, over the ensuing 6.5 hours  The patient made no cervical change despite objectively adequate labor with an IUPC in place.  When I examined the patient the baby was asynclitic with the right frontal bone being slightly in front of the left frontal bone. The orientation was ROA.    Over a low transverse incision was delivered a viable female with Apgars of 9 and 9 weighing pending lbs.  oz. Uterus, tubes and ovaries were all normal.  There were no other significant findings  Description of operation:  Patient was taken to the operating room and placed in the sitting position where she underwent a spinal anesthetic. She was then placed in the supine position with tilt to the left side. When adequate anesthetic level was obtained she was prepped and draped in usual sterile fashion and a Foley catheter was placed. A Pfannenstiel skin incision was made and carried down sharply to the rectus fascia which was scored in the midline extended laterally. The fascia was taken off the muscles both superiorly and without difficulty. The muscles were divided.  The peritoneal cavity was entered.  Bladder blade was placed, no bladder flap was created.  A low transverse hysterotomy incision was made and delivered a viable female  infant at [id number] with Apgars of 9 and 9 weighingpending lbs  oz.  Cord pH was obtained and was pending. The uterus was exteriorized. It was closed in 2 layers, the first  being a running interlocking layer and the second being an imbricating layer using 0 monocryl on a CTX needle. There was good resulting hemostasis. The uterus tubes and ovaries were all normal. Peritoneal cavity was irrigated vigorously. The muscles and peritoneum were reapproximated loosely. The fascia was closed using 0 Vicryl in running fashion. Subcutaneous tissue was made hemostatic and irrigated. The skin was closed using 4-0 Vicryl on a Keith needle in a subcuticular fashion.  Dermabond was placed for additional wound integrity and to serve as a barrier. Blood loss for the procedure was 500 cc. The patient received 2 grams of Ancef prophylactically. The patient was taken to the recovery room in good stable condition with all counts being correct x3.  EBL 500 cc  Schylar Allard H 08/18/2016 3:43 PM

## 2016-08-18 NOTE — Anesthesia Preprocedure Evaluation (Signed)

## 2016-08-18 NOTE — Progress Notes (Signed)
Margaret Jones is a 21 y.o. G2P1001 at 4661w1d by ultrasound admitted for induction of labor due to PPROM.  Subjective:   Objective: BP 118/69   Pulse (!) 117   Temp 98.2 F (36.8 C) (Oral)   Resp 18   Ht 5\' 2"  (1.575 m)   Wt 194 lb (88 kg)   LMP 11/18/2015   SpO2 100%   BMI 35.48 kg/m  No intake/output data recorded. No intake/output data recorded.  FHT:  FHR: 135 bpm, variability: moderate,  accelerations:  Present,  decelerations:  Absent UC:   regular, every 3 minutes SVE:   Dilation: 9 Effacement (%): 90 Station: 0, +1 Exam by:: Azzie GlatterStephanie Faulk,RN  Labs: Lab Results  Component Value Date   WBC 9.2 08/17/2016   HGB 9.1 (L) 08/17/2016   HCT 29.7 (L) 08/17/2016   MCV 71.7 (L) 08/17/2016   PLT 338 08/17/2016    Assessment / Plan: 7861w1d Estimated Date of Delivery: 08/24/16  Previous Caesarean section Asyncltism with arrest of dilatation and descent with adequate labor  Labor: arrested Preeclampsia:   Fetal Wellbeing:  Category I Pain Control:  IV pain meds I/D:   Anticipated MOD:  Proceed with repeat Caesarean section  EURE,LUTHER H 08/18/2016, 2:20 PM

## 2016-08-19 LAB — CBC
HCT: 24.1 % — ABNORMAL LOW (ref 36.0–46.0)
Hemoglobin: 7.4 g/dL — ABNORMAL LOW (ref 12.0–15.0)
MCH: 22 pg — ABNORMAL LOW (ref 26.0–34.0)
MCHC: 30.7 g/dL (ref 30.0–36.0)
MCV: 71.7 fL — AB (ref 78.0–100.0)
PLATELETS: 295 10*3/uL (ref 150–400)
RBC: 3.36 MIL/uL — ABNORMAL LOW (ref 3.87–5.11)
RDW: 16.4 % — AB (ref 11.5–15.5)
WBC: 15.9 10*3/uL — AB (ref 4.0–10.5)

## 2016-08-19 NOTE — Anesthesia Postprocedure Evaluation (Signed)
Anesthesia Post Note  Patient: Margaret Jones  Procedure(s) Performed: Procedure(s) (LRB): CESAREAN SECTION (N/A)  Patient location during evaluation: Mother Baby Anesthesia Type: Epidural Level of consciousness: awake and alert and oriented Pain management: pain level controlled Vital Signs Assessment: post-procedure vital signs reviewed and stable Respiratory status: spontaneous breathing and nonlabored ventilation Cardiovascular status: stable Postop Assessment: no headache, epidural receding, no backache, patient able to bend at knees, no signs of nausea or vomiting and adequate PO intake Anesthetic complications: no        Last Vitals:  Vitals:   08/19/16 0504 08/19/16 0915  BP: (!) 104/46 (!) 96/43  Pulse: 67 (!) 54  Resp: 14 16  Temp: 36.6 C 36.6 C    Last Pain:  Vitals:   08/19/16 0915  TempSrc: Oral  PainSc: 3    Pain Goal: Patients Stated Pain Goal: 4 (08/17/16 1944)               Laban EmperorMalinova,Tyger Wichman Hristova

## 2016-08-19 NOTE — Addendum Note (Signed)
Addendum  created 08/19/16 1610 by Yolonda Kidaarver, Vanessa Kampf L, CRNA   Sign clinical note

## 2016-08-19 NOTE — Progress Notes (Signed)
Subjective: Postpartum Day 1: Cesarean Delivery Patient reports incisional pain and tolerating PO.    Objective: Vital signs in last 24 hours: Temp:  [97.7 F (36.5 C)-98.7 F (37.1 C)] 97.9 F (36.6 C) (05/21 0504) Pulse Rate:  [67-132] 67 (05/21 0504) Resp:  [14-20] 14 (05/21 0504) BP: (91-130)/(46-83) 104/46 (05/21 0504) SpO2:  [97 %-100 %] 97 % (05/21 0504)  Physical Exam:  General: alert, cooperative and no distress Lochia: appropriate Uterine Fundus: firm Incision: healing well, no significant drainage DVT Evaluation: No evidence of DVT seen on physical exam.   Recent Labs  08/17/16 1958 08/19/16 0531  HGB 9.1* 7.4*  HCT 29.7* 24.1*    Assessment/Plan: Status post Cesarean section. Doing well postoperatively.  Continue current care.  Wynelle BourgeoisMarie Miche Loughridge 08/19/2016, 7:22 AM

## 2016-08-19 NOTE — Addendum Note (Signed)
Addendum  created 08/19/16 1056 by Elgie CongoMalinova, Kensli Bowley H, CRNA   Sign clinical note

## 2016-08-19 NOTE — Lactation Note (Signed)
This note was copied from a baby's chart. Lactation Consultation Note  Patient Name: Margaret Jones Today's Date: 08/19/2016 Reason for consult: Initial assessment   With this experienced breast feeding mom and term baby, now 3221 hours old. I observed mom latching her baby in cradle hold. She agreed to allowing me to show her cross cradle hold. The baby was sleepy at first, but then latched deeply, almost self latching, and fed well for 22 minutes. Basic breast feeding teaching done with mom, and lactation services also reviewed. Momm very receptive to teaching. She was concerned that her milk had not come in, and remembers having an abundant supply on the first day. I told mom she may have had a lot of colostrum, but that milk usually does not transition  In until 48-72 hours pp. I reviewed hand expression with mom, and showed her that she does have easily expressed colostrum, which seemed to make her feel better about her supply. Mom knows to call for questions/conerns.   Maternal Data Formula Feeding for Exclusion: No Has patient been taught Hand Expression?: Yes  Feeding Feeding Type: Breast Fed Length of feed: 22 min  LATCH Score/Interventions Latch: Repeated attempts needed to sustain latch, nipple held in mouth throughout feeding, stimulation needed to elicit sucking reflex.  Audible Swallowing: A few with stimulation Intervention(s): Skin to skin;Hand expression  Type of Nipple: Everted at rest and after stimulation  Comfort (Breast/Nipple): Soft / non-tender     Hold (Positioning): Assistance needed to correctly position infant at breast and maintain latch. Intervention(s): Breastfeeding basics reviewed;Support Pillows;Position options;Skin to skin  LATCH Score: 7  Lactation Tools Discussed/Used     Consult Status Consult Status: Follow-up Date: 08/20/16 Follow-up type: In-patient    Alfred LevinsLee, [name] 08/19/2016, 12:49 PM

## 2016-08-19 NOTE — Anesthesia Postprocedure Evaluation (Signed)
Anesthesia Post Note  Patient: Margaret Jones  Procedure(s) Performed: Procedure(s) (LRB): CESAREAN SECTION (N/A)  Patient location during evaluation: Mother Baby Anesthesia Type: Epidural Level of consciousness: awake, awake and alert, oriented and patient cooperative Pain management: pain level controlled Vital Signs Assessment: post-procedure vital signs reviewed and stable Respiratory status: spontaneous breathing, nonlabored ventilation and respiratory function stable Cardiovascular status: stable Postop Assessment: no headache, no backache, patient able to bend at knees and no signs of nausea or vomiting Anesthetic complications: no        Last Vitals:  Vitals:   08/19/16 0915 08/19/16 1255  BP: (!) 96/43 (!) 108/45  Pulse: (!) 54 60  Resp: 16 18  Temp: 36.6 C 37 C    Last Pain:  Vitals:   08/19/16 1355  TempSrc:   PainSc: 2    Pain Goal: Patients Stated Pain Goal: 4 (08/17/16 1944)               Leean Amezcua L

## 2016-08-20 MED ORDER — OXYCODONE-ACETAMINOPHEN 5-325 MG PO TABS: 1.0000 | ORAL_TABLET | ORAL | 0 refills | Status: AC | PRN

## 2016-08-20 MED ORDER — NORETHINDRONE 0.35 MG PO TABS: 1.0000 | ORAL_TABLET | Freq: Every day | ORAL | 11 refills | Status: AC

## 2016-08-20 MED ORDER — SODIUM CHLORIDE 0.9 % IV SOLN
510.0000 mg | Freq: Once | INTRAVENOUS | Status: AC
  Administered 2016-08-20: 510 mg via INTRAVENOUS
  Filled 2016-08-20: qty 17

## 2016-08-20 MED ORDER — PRENATAL VITAMINS 0.8 MG PO TABS: 1.0000 | ORAL_TABLET | Freq: Every day | ORAL | 12 refills | Status: AC

## 2016-08-20 MED ORDER — IBUPROFEN 600 MG PO TABS: 600.0000 mg | ORAL_TABLET | Freq: Four times a day (QID) | ORAL | 0 refills | Status: AC

## 2016-08-20 NOTE — Discharge Instructions (Signed)

## 2016-08-20 NOTE — Lactation Note (Signed)
This note was copied from a baby's chart. Lactation Consultation Note  Patient Name: Margaret Jones [id number]'s Date: 08/20/2016 Reason for consult: Follow-up assessment   Baby sleeping after recently bf for 10 min.  Discussed waking techniques and encouraging longer feeds. Mother states baby has been cluster feeding. Provided mother w/ breastpad since she is concerned that she is leaking but happy her milk supply is transitioning. Mom encouraged to feed baby 8-12 times/24 hours and with feeding cues.  Mother denies other concerns or questions.  Maternal Data    Feeding Feeding Type: Breast Fed Length of feed: 10 min  LATCH Score/Interventions                      Lactation Tools Discussed/Used     Consult Status Consult Status: Follow-up Date: 08/21/16 Follow-up type: In-patient    Dahlia ByesBerkelhammer, [facility] 08/20/2016, 4:21 PM

## 2016-08-20 NOTE — Progress Notes (Signed)
Noted hemoglobin 7.4. Discussed w/ patient, who says today when ambulating felt light-headed. bp wnl, hr mildly elevated to low 100s. Discussed transfusion vs iv iron. Explained given symptoms transfusion of 1 unit is recommended by me, but iv iron is reasonable alternative. Pt wants iv iron so will order one dose feraheme, but cautioned patient to monitor symptoms overnight and tomorrow morning, and should they worsen to let us know, as transfusion would then definitely be indicated. Will check am cbc to ensure hemoglobin trending steady in 7s.

## 2016-08-20 NOTE — Discharge Summary (Addendum)
OB Discharge Summary  Patient Name: Margaret Jones DOB: 1995-11-11 MRN: [id number]  Date of admission: 08/17/2016 Delivering MD: Lazaro ArmsEURE, LUTHER H   Date of discharge: 08/20/2016  Admitting diagnosis: 39 WKS, WATER BROKE Intrauterine pregnancy: 517w1d     Secondary diagnosis:Active Problems:   PROM (premature rupture of membranes)  Additional problems:previous c/s for CPD, obesity     Discharge diagnosis: Term Pregnancy Delivered                                                                      Augmentation: Cytotec and Foley Balloon  Complications: None  Hospital course:  Onset of Labor With Unplanned C/S  21 y.o. yo [id number] at 6817w1d was admitted in Latent Labor on 08/17/2016. Patient had a labor course significant for CPD. Membrane Rupture Time/Date: 6:00 PM ,08/17/2016   The patient went for cesarean section due to Arrest of Descent and previous c/s, and delivered a Viable infant,08/18/2016  Details of operation can be found in separate operative note. Patient had an uncomplicated postpartum course.  She is ambulating,tolerating a regular diet, passing flatus, and urinating well.  Patient is discharged home in stable condition 08/20/16.  Physical exam  Vitals:   08/19/16 0915 08/19/16 1255 08/19/16 1655 08/20/16 0530  BP: (!) 96/43 (!) 108/45 (!) 93/39 (!) 99/52  Pulse: (!) 54 60 61 66  Resp: 16 18 18 18   Temp: 97.8 F (36.6 C) 98.6 F (37 C) 98.1 F (36.7 C) 98.5 F (36.9 C)  TempSrc: Oral Oral Oral Oral  SpO2: 100% 100%    Weight:      Height:       General: alert Lochia: appropriate Uterine Fundus: firm Incision: N/A DVT Evaluation: No evidence of DVT seen on physical exam. Labs: Lab Results  Component Value Date   WBC 15.9 (H) 08/19/2016   HGB 7.4 (L) 08/19/2016   HCT 24.1 (L) 08/19/2016   MCV 71.7 (L) 08/19/2016   PLT 295 08/19/2016   CMP Latest Ref Rng & Units 01/12/2016  Glucose 65 - 99 mg/dL 93  BUN 6 - 20 mg/dL 8  Creatinine 1.470.44 - 8.291.00 mg/dL  5.620.65  Sodium 130135 - 865145 mmol/L 134(L)  Potassium 3.5 - 5.1 mmol/L 4.0  Chloride 101 - 111 mmol/L 103  CO2 22 - 32 mmol/L 24  Calcium 8.9 - 10.3 mg/dL 9.6  Total Protein 6.5 - 8.1 g/dL 7.6  Total Bilirubin 0.3 - 1.2 mg/dL 0.6  Alkaline Phos 38 - 126 U/L 54  AST 15 - 41 U/L 19  ALT 14 - 54 U/L 27    Discharge instruction: per After Visit Summary and "Baby and Me Booklet".  After Visit Meds:  Allergies as of 08/20/2016      Reactions   Rocephin [ceftriaxone] Anaphylaxis   Able to take -cillins w/ out reaction.       Medication List    TAKE these medications   acetaminophen 500 MG tablet Commonly known as:  TYLENOL Take 1,000 mg by mouth every 6 (six) hours as needed for mild pain or headache.   calcium carbonate 500 MG chewable tablet Commonly known as:  TUMS - dosed in mg elemental calcium Chew 1 tablet by mouth daily.   ibuprofen  600 MG tablet Commonly known as:  ADVIL,MOTRIN Take 1 tablet (600 mg total) by mouth every 6 (six) hours.   norethindrone 0.35 MG tablet Commonly known as:  MICRONOR,CAMILA,ERRIN Take 1 tablet (0.35 mg total) by mouth daily.   oxyCODONE-acetaminophen 5-325 MG tablet Commonly known as:  PERCOCET/ROXICET Take 1 tablet by mouth every 4 (four) hours as needed (pain scale 4-7).   Prenatal Vitamins 0.8 MG tablet Take 1 tablet by mouth daily.       Diet: routine diet  Activity: Advance as tolerated. Pelvic rest for 6 weeks.   Outpatient follow up:4 weeks Follow up Appt:No future appointments. Follow up visit: No Follow-up on file.  Postpartum contraception: Progesterone only pills  Newborn Data: Live born female  Birth Weight: 7 lb 4.6 oz (3305 g) APGAR: 9, 9  Baby Feeding: Breast Disposition:home with mother   08/20/2016 Allie Bossier, MD

## 2016-08-21 ENCOUNTER — Encounter: Payer: Medicaid Other | Admitting: Advanced Practice Midwife

## 2016-08-21 LAB — CBC
HCT: 26.2 % — ABNORMAL LOW (ref 36.0–46.0)
Hemoglobin: 8 g/dL — ABNORMAL LOW (ref 12.0–15.0)
MCH: 22.3 pg — ABNORMAL LOW (ref 26.0–34.0)
MCHC: 30.5 g/dL (ref 30.0–36.0)
MCV: 73.2 fL — ABNORMAL LOW (ref 78.0–100.0)
Platelets: 358 10*3/uL (ref 150–400)
RBC: 3.58 MIL/uL — ABNORMAL LOW (ref 3.87–5.11)
RDW: 16.9 % — AB (ref 11.5–15.5)
WBC: 8.1 10*3/uL (ref 4.0–10.5)

## 2016-08-21 NOTE — Progress Notes (Signed)
Pt asleep with infant in bed, awakened pt, educated and attempted to move infant to crib pt refused to have infant relocated.

## 2016-08-21 NOTE — Discharge Summary (Signed)
Patient Name: Margaret Jones DOB: [birth year] MRN: [id number]  Date of admission: 08/17/2016 Delivering MD: Lazaro ArmsEURE, LUTHER H   Date of discharge: 08/20/2016  Admitting diagnosis: 39 WKS, WATER BROKE Intrauterine pregnancy: 4312w1d     Secondary diagnosis:Active Problems:   PROM (premature rupture of membranes)  Additional problems:previous c/s for CPD, obesity , anemia    Discharge diagnosis: Term Pregnancy Delivered                                                                      Augmentation: Cytotec and Foley Balloon  Complications: None  Hospital course:  Onset of Labor With Unplanned C/S  21 y.o. yo [id number] at 7512w1d was admitted in Latent Labor on 08/17/2016. Patient had a labor course significant for CPD. Membrane Rupture Time/Date: 6:00 PM ,08/17/2016   The patient went for cesarean section due to Arrest of Descent and previous c/s, and delivered a Viable infant,08/18/2016  Details of operation can be found in separate operative note. Patient had an uncomplicated postpartum course.  She is ambulating,tolerating a regular diet, passing flatus, and urinating well.  Patient is discharged home in stable condition 08/20/16. She had IV iron infused on POD#2.  Physical exam  Vitals:   08/19/16 0915 08/19/16 1255 08/19/16 1655 08/20/16 0530  BP: (!) 96/43 (!) 108/45 (!) 93/39 (!) 99/52  Pulse: (!) 54 60 61 66  Resp: 16 18 18 18   Temp: 97.8 F (36.6 C) 98.6 F (37 C) 98.1 F (36.7 C) 98.5 F (36.9 C)  TempSrc: Oral Oral Oral Oral  SpO2: 100% 100%    Weight:      Height:       General: alert Lochia: appropriate Uterine Fundus: firm Incision: N/A DVT Evaluation: No evidence of DVT seen on physical exam. Labs: Lab Results  Component Value Date   WBC 15.9 (H) 08/19/2016   HGB 7.4 (L) 08/19/2016   HCT 24.1 (L) 08/19/2016   MCV 71.7 (L) 08/19/2016   PLT 295 08/19/2016   CMP Latest Ref Rng & Units 01/12/2016  Glucose 65 - 99 mg/dL 93  BUN 6 - 20 mg/dL 8  Creatinine 2.840.44 -  1.321.00 mg/dL 4.400.65  Sodium 102135 - 725145 mmol/L 134(L)  Potassium 3.5 - 5.1 mmol/L 4.0  Chloride 101 - 111 mmol/L 103  CO2 22 - 32 mmol/L 24  Calcium 8.9 - 10.3 mg/dL 9.6  Total Protein 6.5 - 8.1 g/dL 7.6  Total Bilirubin 0.3 - 1.2 mg/dL 0.6  Alkaline Phos 38 - 126 U/L 54  AST 15 - 41 U/L 19  ALT 14 - 54 U/L 27    Discharge instruction: per After Visit Summary and "Baby and Me Booklet".  After Visit Meds:  Allergies as of 08/20/2016      Reactions   Rocephin [ceftriaxone] Anaphylaxis   Able to take -cillins w/ out reaction.       Medication List    TAKE these medications   acetaminophen 500 MG tablet Commonly known as:  TYLENOL Take 1,000 mg by mouth every 6 (six) hours as needed for mild pain or headache.   calcium carbonate 500 MG chewable tablet Commonly known as:  TUMS - dosed in mg elemental calcium Chew 1 tablet by mouth daily.   ibuprofen 600  MG tablet Commonly known as:  ADVIL,MOTRIN Take 1 tablet (600 mg total) by mouth every 6 (six) hours.   norethindrone 0.35 MG tablet Commonly known as:  MICRONOR,CAMILA,ERRIN Take 1 tablet (0.35 mg total) by mouth daily.   oxyCODONE-acetaminophen 5-325 MG tablet Commonly known as:  PERCOCET/ROXICET Take 1 tablet by mouth every 4 (four) hours as needed (pain scale 4-7).   Prenatal Vitamins 0.8 MG tablet Take 1 tablet by mouth daily.       Diet: routine diet  Activity: Advance as tolerated. Pelvic rest for 6 weeks.   Outpatient follow up:4 weeks Follow up Appt:No future appointments. Follow up visit: No Follow-up on file.  Postpartum contraception: Progesterone only pills  Newborn Data: Live born female  Birth Weight: 7 lb 4.6 oz (3305 g) APGAR: 9, 9  Baby Feeding: Breast Disposition:home with mother

## 2016-08-21 NOTE — Lactation Note (Addendum)
This note was copied from a baby's chart. Lactation Consultation Note  Patient Name: Margaret Gwenette Greetlea Brager UJWJX'BToday's Date: 08/21/2016 Reason for consult: Follow-up assessment  Baby 66 hours old. Mom on her phone, states that BF going fine and the only thing she needs is breast pads--pads given with review.   Maternal Data    Feeding Feeding Type: Breast Fed  LATCH Score/Interventions                      Lactation Tools Discussed/Used     Consult Status Consult Status: PRN    Sherlyn HayJennifer D Jahzaria Vary 08/21/2016, 9:24 AM

## 2016-09-25 ENCOUNTER — Ambulatory Visit: Payer: Medicaid Other | Admitting: Student

## 2016-10-11 NOTE — Anesthesia Postprocedure Evaluation (Signed)
Anesthesia Post Note  Patient: Margaret Jones  Procedure(s) Performed: Procedure(s) (LRB): CESAREAN SECTION (N/A)     Anesthesia Post Evaluation  Last Vitals:  Vitals:   08/20/16 1820 08/21/16 0544  BP: 114/62 114/61  Pulse: (!) 102 (!) 58  Resp: 17 18  Temp: 36.6 C 36.9 C    Last Pain:  Vitals:   08/21/16 1155  TempSrc:   PainSc: 4                  Laren Whaling EDWARD

## 2016-10-11 NOTE — Addendum Note (Signed)
Addendum  created 10/11/16 1303 by Domanique Luckett, MD   Sign clinical note    

## 2016-12-23 ENCOUNTER — Inpatient Hospital Stay
Admit: 2016-12-23 | Discharge: 2016-12-23 | Disposition: A | Discharge status: Home / Self Care | Payer: Self-pay | Attending: Emergency Medicine

## 2016-12-23 DIAGNOSIS — R1013 Epigastric pain: Secondary | ICD-10-CM

## 2016-12-23 LAB — POC URINE MACROSCOPIC
Bilirubin: NEGATIVE
Blood: NEGATIVE
Glucose: NEGATIVE mg/dl
Ketone: NEGATIVE mg/dl
Nitrites: NEGATIVE
Protein: NEGATIVE mg/dl
Specific gravity: >=1.03 (ref 1.005–1.030)
Urobilinogen: 0.2 EU/dl (ref 0.0–1.0)
pH (UA): 5 (ref 5–9)

## 2016-12-23 LAB — CBC WITH AUTOMATED DIFF
BASOPHILS: 0.4 % (ref 0–3)
EOSINOPHILS: 0.8 % (ref 0–5)
HCT: 38.2 % (ref 37.0–50.0)
HGB: 12.6 gm/dl — ABNORMAL LOW (ref 13.0–17.2)
IMMATURE GRANULOCYTES: 0.1 % (ref 0.0–3.0)
LYMPHOCYTES: 31.8 % (ref 28–48)
MCH: 28.7 pg (ref 25.4–34.6)
MCHC: 33 gm/dl (ref 30.0–36.0)
MCV: 87 fL (ref 80.0–98.0)
MONOCYTES: 6.8 % (ref 1–13)
MPV: 10.1 fL — ABNORMAL HIGH (ref 6.0–10.0)
NEUTROPHILS: 60.1 % (ref 34–64)
NRBC: 0 (ref 0–0)
PLATELET: 297 10*3/uL (ref 140–450)
RBC: 4.39 M/uL (ref 3.60–5.20)
RDW-SD: 39.8 (ref 36.4–46.3)
WBC: 7.2 10*3/uL (ref 4.0–11.0)

## 2016-12-23 LAB — METABOLIC PANEL, COMPREHENSIVE
ALT (SGPT): 94 U/L — ABNORMAL HIGH (ref 12–78)
AST (SGOT): 39 U/L — ABNORMAL HIGH (ref 15–37)
Albumin: 4.4 gm/dl (ref 3.4–5.0)
Alk. phosphatase: 82 U/L (ref 45–117)
Anion gap: 10 mmol/L (ref 5–15)
BUN: 13 mg/dl (ref 7–25)
Bilirubin, total: 0.5 mg/dl (ref 0.2–1.0)
CO2: 23 mEq/L (ref 21–32)
Calcium: 9.1 mg/dl (ref 8.5–10.1)
Chloride: 105 mEq/L (ref 98–107)
Creatinine: 0.7 mg/dl (ref 0.6–1.3)
GFR est AA: >60
GFR est non-AA: >60
Glucose: 82 mg/dl (ref 74–106)
Potassium: 3.7 mEq/L (ref 3.5–5.1)
Protein, total: 8.7 gm/dl — ABNORMAL HIGH (ref 6.4–8.2)
Sodium: 138 mEq/L (ref 136–145)

## 2016-12-23 LAB — POC HCG,URINE: HCG urine, QL: NEGATIVE

## 2016-12-23 LAB — POC URINE MICROSCOPIC

## 2016-12-23 LAB — LIPASE: Lipase: 140 U/L (ref 73–393)

## 2016-12-23 MED ORDER — SUCRALFATE 100 MG/ML ORAL SUSP
100 mg/mL | ORAL | Status: AC
Start: 2016-12-23 — End: 2016-12-23
  Administered 2016-12-23: 18:00:00 via ORAL

## 2016-12-23 MED ORDER — SODIUM CHLORIDE 0.9% BOLUS IV
0.9 % | INTRAVENOUS | Status: AC
Start: 2016-12-23 — End: 2016-12-23
  Administered 2016-12-23: 17:00:00 via INTRAVENOUS

## 2016-12-23 MED ORDER — SODIUM CHLORIDE 0.9 % IJ SYRG
Freq: Once | INTRAMUSCULAR | Status: AC
Start: 2016-12-23 — End: 2016-12-23
  Administered 2016-12-23: 17:00:00 via INTRAVENOUS

## 2016-12-23 MED ORDER — SUCRALFATE 1 GRAM TAB
1 gram | ORAL_TABLET | Freq: Four times a day (QID) | ORAL | 0 refills | Status: DC
Start: 2016-12-23 — End: 2017-05-02

## 2016-12-23 MED FILL — SUCRALFATE 100 MG/ML ORAL SUSP: 100 mg/mL | ORAL | Qty: 10

## 2016-12-23 NOTE — Progress Notes (Signed)
Pt was discharged from ED prior to [facility] meeting with her.  No pcp/no ins.  Pt lives at [facility]- no room # listed.  LC called and spoke to pt pt states that she does receive mail at her location and her room # is 209.  LC sent to pt in the mail: a letter, cceva info, script savings card, financial app, list of [facility] and the sept/oct cav calendar.

## 2016-12-23 NOTE — ED Triage Notes (Signed)
Pt to er with c/o epigastric abdominal pain along with Nausea and dizziness.

## 2016-12-23 NOTE — ED Provider Notes (Signed)
[facility]  Emergency Department Treatment Report    Patient: Michelle Alvarado Age: 21 y.o. Sex: female    Date of Birth: 10-Nov-1995 Admit Date: 12/23/2016 PCP: None   MRN: [id number]  CSN: [id number]  Attending: Oneita Hurt, MD   Room: ER12/ER12 Time Dictated: 12:42 PM APP: [facility]       Chief Complaint   Chief Complaint   Patient presents with   ??? Abdominal Pain       History of Present Illness   21 y.o. female presents to the ED, accompanied by family, complaining of intermittent, 5/10, epigastric cramping, bloating and tigthness starting 2 weeks ago. She states her epigastric pain was initially constant but is now intermittent in nature; also experiences low back soreness with intermittent tightness. Patient reports her epigastric pain is worse after eating. Notes intermittent nausea, constipation and diarrhea. Denies vomiting. Patient reports taking at home and clinic pregnancy testes recently which were negative, G2P2A0; currently breast feeding her 79 month old daughter. She does take Tylenol prn.     Review of Systems   Constitutifonal: No fevers  Eyes: No visual symptoms.  ENT: No URI symptoms  Respiratory: No difficulty breathing  Cardoivascular: No chest pain  Gastrointenstinal: Positive for epigastric pain, intermittent diarrhea, constipation. No vomiting.  Genitourinary: No urinary symptoms.  Musculoskeletal: No joint pain  Integumentary: No rashes.  Neurological: No headaches    Past Medical/Surgical History   Cholecystectomy  C-section x 2    Social History   Nonsmoker    Family History   Unknown    Home Medications     None         Allergies   No Known Allergies    Physical Exam     Visit Vitals   ??? BP 127/69   ??? Pulse 79   ??? Temp 99.5 ??F (37.5 ??C)   ??? Resp 16   ??? Ht 5' 2"  (1.575 m)   ??? Wt 74.8 kg (164 lb 14.5 oz)   ??? SpO2 99%   ??? BMI 30.16 kg/m2     Constitutional: Patient appears well developed and well nourished.  Appearance and behavior are age and situation appropriate.   HEENT: Conjunctiva clear.  Mucous membranes moist  Respiratory: lungs clear to auscultation,no respiratory distress. No tachypnea or accessory muscle use.  Cardiovascular: heart regular rate and rhythm without murmur rubs or gallops.   Vascular: No peripheral edema or significant variscosities.    Gastrointestinal:  Abdomen soft, . Tenderness noted in the epigastric area.  No distention, organomegaly, or masses.  No lower abdominal tenderness.  No rebound or guarding.  No vomiting.  Musculoskeletal: Moving extremities appropriately.  Integumentary: No rashes noted.  Neurologic: alert and oriented, answering questions appropriately.     Impression and Management Plan   21 year old female presents with epigastric abdominal pain.  At this time we will obtain GI labs and a pregnancy test.  We will treat her symptomatically.  Diagnostic Studies   Lab:     Recent Results (from the past 24 hour(s))   CBC WITH AUTOMATED DIFF    Collection Time: 12/23/16  1:05 PM   Result Value Ref Range    WBC 7.2 4.0 - 11.0 1000/mm3    RBC 4.39 3.60 - 5.20 M/uL    HGB 12.6 (L) 13.0 - 17.2 gm/dl    HCT 38.2 37.0 - 50.0 %    MCV 87.0 80.0 - 98.0 fL    MCH 28.7 25.4 - 34.6 pg  MCHC 33.0 30.0 - 36.0 gm/dl    PLATELET 297 140 - 450 1000/mm3    MPV 10.1 (H) 6.0 - 10.0 fL    RDW-SD 39.8 36.4 - 46.3      NRBC 0 0 - 0      IMMATURE GRANULOCYTES 0.1 0.0 - 3.0 %    NEUTROPHILS 60.1 34 - 64 %    LYMPHOCYTES 31.8 28 - 48 %    MONOCYTES 6.8 1 - 13 %    EOSINOPHILS 0.8 0 - 5 %    BASOPHILS 0.4 0 - 3 %   METABOLIC PANEL, COMPREHENSIVE    Collection Time: 12/23/16  1:05 PM   Result Value Ref Range    Sodium 138 136 - 145 mEq/L    Potassium 3.7 3.5 - 5.1 mEq/L    Chloride 105 98 - 107 mEq/L    CO2 23 21 - 32 mEq/L    Glucose 82 74 - 106 mg/dl    BUN 13 7 - 25 mg/dl    Creatinine 0.7 0.6 - 1.3 mg/dl    GFR est AA >60.0      GFR est non-AA >60      Calcium 9.1 8.5 - 10.1 mg/dl    AST (SGOT) 39 (H) 15 - 37 U/L    ALT (SGPT) 94 (H) 12 - 78 U/L     Alk. phosphatase 82 45 - 117 U/L    Bilirubin, total 0.5 0.2 - 1.0 mg/dl    Protein, total 8.7 (H) 6.4 - 8.2 gm/dl    Albumin 4.4 3.4 - 5.0 gm/dl    Anion gap 10 5 - 15 mmol/L   LIPASE    Collection Time: 12/23/16  1:05 PM   Result Value Ref Range    Lipase 140 73 - 393 U/L   POC HCG,URINE    Collection Time: 12/23/16  2:37 PM   Result Value Ref Range    HCG urine, QL negative NEGATIVE,Negative,negative     POC URINE MACROSCOPIC    Collection Time: 12/23/16  2:38 PM   Result Value Ref Range    Glucose Negative NEGATIVE,Negative mg/dl    Bilirubin Negative NEGATIVE,Negative      Ketone Negative NEGATIVE,Negative mg/dl    Specific gravity >=1.030 1.005 - 1.030      Blood Negative NEGATIVE,Negative      pH (UA) 5.0 5 - 9      Protein Negative NEGATIVE,Negative mg/dl    Urobilinogen 0.2 0.0 - 1.0 EU/dl    Nitrites Negative NEGATIVE,Negative      Leukocyte Esterase Trace (A) NEGATIVE,Negative      Color Yellow      Appearance Clear     POC URINE MICROSCOPIC    Collection Time: 12/23/16  2:38 PM   Result Value Ref Range    Epithelial cells, squamous 5-9 /LPF    WBC 1-4 /HPF    RBC OCCASIONAL /HPF    Bacteria 1+ /HPF         ED Course     Medications   sodium chloride (NS) flush 5-10 mL (10 mL IntraVENous Given 12/23/16 1248)   sodium chloride 0.9 % bolus infusion 1,000 mL (0 mL IntraVENous IV Completed 12/23/16 1452)   sucralfate (CARAFATE) 100 mg/mL oral suspension 1 g (1 g Oral Given 12/23/16 1402)       Medical Decision Making   21 year old female with epigastric abdominal pain.  LFTs are slightly elevated but total bilirubin is normal.  She has a history of  a cholecystectomy.  Lipase is normal.  Pregnancy test is negative.  I did advise her to follow-up with GI.  We have given a referral to GI. We will start the patient on Carafate.  Final Diagnosis     1. Abdominal pain, epigastric          Disposition   Patient discharged home in stable condition. Advised to follow up with  their PCP and GI. Patient advised to return to the ED for any new or worsening symptoms.       My Medications      START taking these medications       Instructions Each Dose to Equal   Morning Noon Evening Bedtime    sucralfate 1 gram tablet   Commonly known as:  CARAFATE       Your last dose was:         Your next dose is:              Take 1 Tab by mouth four (4) times daily.    1 g                             Where to Get Your Medications      Information on where to get these meds will be given to you by the nurse or doctor.     ! Ask your nurse or doctor about these medications    ??? sucralfate 1 gram tablet             Scribe Attestation     Xipi Amin acting as a Education administrator for and in the presence of [employer], PA-C  December 23, 2016 at 12:59 PM       Provider Attestation:         I personally performed the services described in the documentation, reviewed the documentation, as recorded by the scribe in my presence, and it accurately and completely records my words and actions. December 23, 2016 at 12:59 PM - Cathe Mons, PA-C    The patient was personally evaluated by myself and discussed with Oneita Hurt, MD who agrees with the above assessment and plan.      Dragon medical dictation software was used for portions of this report. Unintended errors may occur.     Cathe Mons, PA-C  December 23, 2016    My signature above authenticates this document and my orders, the final ??  diagnosis (es), discharge prescription (s), and instructions in the Epic ??  record.  If you have any questions please contact [phone].  ??  Nursing notes have been reviewed by the physician/ advanced practice ??  Clinician.

## 2016-12-23 NOTE — ED Notes (Signed)
2:53 PM  12/23/16     Discharge instructions given to patient (name) with verbalization of understanding. Patient accompanied by family.  Patient discharged with the following prescriptions carafate. Patient discharged to home (destination).      Current Discharge Medication List      START taking these medications    Details   sucralfate (CARAFATE) 1 gram tablet Take 1 Tab by mouth four (4) times daily.  Qty: 40 Tab, Refills: 0               Ilda Basset

## 2017-05-02 DIAGNOSIS — K120 Recurrent oral aphthae: Secondary | ICD-10-CM

## 2017-05-02 NOTE — ED Triage Notes (Signed)
Inner bottom lip --  Canker sore  Started yesterday

## 2017-05-02 NOTE — ED Notes (Addendum)
Patient reports that she does not want to be "in a room for prisoners." Patient also does not believe providers will be able to clean their hands prior to entering because there is no sink in the room. Consulting civil engineerCharge RN notified. Patient ambulated to waiting area.

## 2017-05-03 ENCOUNTER — Inpatient Hospital Stay
Admit: 2017-05-03 | Discharge: 2017-05-03 | Disposition: A | Discharge status: Home / Self Care | Payer: PRIVATE HEALTH INSURANCE | Attending: Emergency Medicine

## 2017-05-03 MED ORDER — ALUM-MAG HYDROXIDE-SIMETH 400 MG-400 MG-40 MG/5 ML ORAL SUSP
400-400-40 mg/5 mL | ORAL | Status: AC
Start: 2017-05-03 — End: 2017-05-03
  Administered 2017-05-03: 05:00:00 via ORAL

## 2017-05-03 MED FILL — MAG-AL PLUS EXTRA STRENGTH 400 MG-400 MG-40 MG/5 ML ORAL SUSPENSION: 400-400-40 mg/5 mL | ORAL | Qty: 30

## 2017-05-03 NOTE — ED Notes (Signed)
Pt given d/c instructions  Pt verbally understood.  Pt sent home with family

## 2017-05-03 NOTE — ED Provider Notes (Signed)
[facility]  Emergency Department Treatment Report    Patient: Michelle Alvarado Age: 22 y.o. Sex: female    Date of Birth: [birth year] Admit Date: 05/02/2017 PCP: None   MRN: [id number]  CSN: [id number]  Attending: Candace Cruise, MD   Room: WG95/AO13 Time Dictated: 12:00 AM APP: Lily Lovings, PA-C       Chief Complaint   Chief Complaint   Patient presents with   ??? Gum Problem       History of Present Illness   22 y.o. female presenting to the ED for evaluation of pain on the inside of her lower lip which she believes is a "canker sore."  Started 2 days ago.  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  It also hurts when she eats certain foods such as acidic foods and when she brushes her teeth.  She states she has taken ibuprofen without relief.  The pain is burning and sore and moderate in severity.  It makes her feel like her lip is swollen but it is not.  She also mentions sore throat.    Review of Systems   Review of Systems   Constitutional: Negative for chills and fever.   HENT: Positive for sore throat. Negative for congestion.         As above   Eyes: Negative for discharge and redness.   Respiratory: Negative for cough.    Gastrointestinal: Negative for vomiting.   Musculoskeletal: Negative for myalgias.   Skin: Negative for rash.       Past Medical/Surgical History   History reviewed. No pertinent past medical history.     Past Surgical History:   Procedure Laterality Date   ??? HX CESAREAN SECTION     ??? HX CHOLECYSTECTOMY  2017       Social History     Social History     Socioeconomic History   ??? Marital status: SINGLE     Spouse name: Not on file   ??? Number of children: Not on file   ??? Years of education: Not on file   ??? Highest education level: Not on file   Tobacco Use   ??? Smoking status: Never Smoker   ??? Smokeless tobacco: Never Used       Family History   History reviewed. No pertinent family history.    Current Medications     None       Allergies     Allergies    Allergen Reactions   ??? Rocephin [Ceftriaxone] Anaphylaxis       Physical Exam     ED Triage Vitals   Enc Vitals Group      BP 05/02/17 2240 127/83      Pulse (Heart Rate) 05/02/17 2240 93      Resp Rate 05/02/17 2240 14      Temp 05/02/17 2240 97.9 ??F (36.6 ??C)      Temp src --       O2 Sat (%) 05/02/17 2240 100 %      Weight 05/02/17 2233 180 lb      Height 05/02/17 2233 5\' 2"      Physical Exam   Constitutional: She is oriented to person, place, and time.   Well developed and well nourished. Appearance and behavior are appropriate for age and situation.   HENT:   Head: Normocephalic.   Nose: No rhinorrhea.   Mouth/Throat: Uvula is midline. Mucous membranes are not dry. No  trismus in the jaw. No oropharyngeal exudate, posterior oropharyngeal edema or posterior oropharyngeal erythema.   Irregular red patches on tongue consistent with geographic tongue  Erythema and superficial ulceration to the left of the lower lip frenulum with tenderness.  There is no surrounding edema, drainage, fluctuance, induration or evidence of infection  No oral lesions elsewhere   Eyes: Conjunctivae are normal. Right eye exhibits no discharge. Left eye exhibits no discharge. No scleral icterus.   Neck: Normal range of motion.   Pulmonary/Chest: Effort normal. No accessory muscle usage. No tachypnea. No respiratory distress.   Lymphadenopathy:        Head (right side): No submental, no submandibular and no tonsillar adenopathy present.        Head (left side): No submental, no submandibular and no tonsillar adenopathy present.     She has no cervical adenopathy.   Neurological: She is alert and oriented to person, place, and time. GCS score is 15.   Skin: Skin is warm and dry. She is not diaphoretic.   Psychiatric:   Cooperative   Nursing note and vitals reviewed.      Impression and Management Plan   This is a 22 y.o. female presenting to the ED for evaluation of aphthous ulcer to the left of her lower lip frenulum.  She is breastfeeding.  Mylanta will be given to apply to the ulcer.  She is afebrile and vital signs stable.  No labs or imaging necessary.    Diagnostic Studies   Lab:   No results found for this or any previous visit (from the past 12 hour(s)).    Imaging:    No results found.    ED Course / Medical Decision Making     ED Course as of May 03 234   Fri May 02, 2017   2300 I went to go evaluate the patient, she was walking on the room telling the room 17 because it looks like that is where "prisoners are locked up."  She states she wants of room where her "doctor can wash their hands and have clean gloves." She was escorted back to the waiting room by her nurse Clayburn Pert until another room is available.  [KC]      ED Course User Index  [KC] Wyvonnia Dusky, PA-C       Medications   aluminum & magnesium hydroxide-simethicone (MYLANTA II) oral suspension 5 mL (5 mL Oral Given 05/03/17 0019)        Patient remained stable during her stay in the ED. discussed that she can get Maalox over-the-counter and use on the area 4 times a day.  Discussed doing warm salt water swishes.  She can take over-the-counter Tylenol and ibuprofen as directed on the packaging for pain.  It should heal in the next few days.  Advised to stay away from acidic foods, spicy foods and sharp foods that can irritate it.  Advised follow-up with primary care and/or dentist.  Advised return to ED for new or worsening symptoms.  All questions were answered and concerns addressed.    Final Diagnosis       ICD-10-CM ICD-9-CM   1. Aphthous ulcer of mouth K12.0 528.2       Disposition     Patient was discharged home in stable condition with discharge instructions as stated above.       The patient was personally evaluated by myself and discussed with [facility], EMILY A, MD who agrees with the above assessment and plan.  Dragon medical dictation software was used for portions of this report. Unintended transcription errors may occur.     Wyvonnia DuskyKate E Daysean Tinkham, PA-C  May 03, 2017       My signature above authenticates this document and my orders, the final ??  diagnosis (es), discharge prescription (s), and instructions in the Epic record.  If you have any questions please contact [phone].  ??  Nursing notes have been reviewed by the physician/ advanced practice Clinician.

## 2017-07-01 ENCOUNTER — Encounter: Payer: Self-pay | Admitting: *Deleted

## 2017-07-25 ENCOUNTER — Emergency Department: Admit: 2017-07-25 | Payer: PRIVATE HEALTH INSURANCE

## 2017-07-25 ENCOUNTER — Inpatient Hospital Stay
Admit: 2017-07-25 | Discharge: 2017-07-25 | Disposition: A | Discharge status: Home / Self Care | Payer: PRIVATE HEALTH INSURANCE | Attending: Emergency Medicine

## 2017-07-25 DIAGNOSIS — O9A211 Injury, poisoning and certain other consequences of external causes complicating pregnancy, first trimester: Secondary | ICD-10-CM

## 2017-07-25 LAB — POC HCG,URINE: HCG urine, QL: POSITIVE — AB

## 2017-07-25 LAB — POC URINE MICROSCOPIC

## 2017-07-25 LAB — POC URINE MACROSCOPIC
Bilirubin: NEGATIVE
Blood: NEGATIVE
Glucose: NEGATIVE mg/dl
Ketone: NEGATIVE mg/dl
Nitrites: NEGATIVE
Specific gravity: >=1.03 (ref 1.005–1.030)
Urobilinogen: 1 EU/dl (ref 0.0–1.0)
pH (UA): 5.5 (ref 5–9)

## 2017-07-25 NOTE — ED Triage Notes (Signed)
Pt reports positive pregnancy test recently, unsure of how far along she is. States yesterday she slipped and landed on her left side and since then has been having pain in the side of her right abdomen.

## 2017-07-25 NOTE — ED Provider Notes (Signed)
[facility]  Emergency Department Treatment Report    Patient: Michelle Alvarado Age: 22 y.o. Sex: female    Date of Birth: 09/03/1995 Admit Date: 07/25/2017 PCP: None   MRN: [id number]  CSN: [id number]  Attending: Christiana Pellant, MD   Room: ER35/ER35 Time Dictated: 8:26 AM APP: Blima Rich, PA-C     Chief Complaint   Chief Complaint   Patient presents with   ??? Fall   ??? Pregnancy Problem        History of Present Illness   22 y.o. female who presents to the ED with left-sided abdominal pain after slipping and falling on her left side last night.  Patient states that she was walking around on laminate flooring when she slipped on her boyfriends belt buckle which is lying on the floor.  Patient sleeps that she landed on her left hip and abdomen.  Patient denies any head injury or loss of consciousness.  She denies any headache today.  Patient has had some abdominal and hip soreness that does not really radiate.  She is also had some mild cramping and nausea.  Patient states that she took a pregnancy test on Saturday that was positive.  Her last menstrual cycle was 5 weeks ago.  She denies any bleeding or spotting.  This will be her third pregnancy and she has had 2 live births.  Past medical history also includes gallstones and she is status post cholecystectomy.  Family history includes diabetes and her father.  She has had no history of gestational diabetes.    Review of Systems   Review of Systems   Constitutional: Negative for chills, diaphoresis and fever.   HENT: Negative for congestion and sore throat.    Eyes: Negative for blurred vision and double vision.   Respiratory: Negative for cough and shortness of breath.    Cardiovascular: Negative for chest pain.   Gastrointestinal: Positive for abdominal pain and nausea. Negative for constipation, diarrhea and vomiting.   Genitourinary: Positive for flank pain. Negative for dysuria.    Musculoskeletal: Positive for falls and joint pain. Negative for myalgias.   Skin: Negative for rash.   Neurological: Negative for dizziness and loss of consciousness.   Endo/Heme/Allergies: Does not bruise/bleed easily.   Psychiatric/Behavioral: Negative for substance abuse.   All other systems reviewed and are negative.      Past Medical/Surgical History   History reviewed. No pertinent past medical history.    Past Surgical History:   Procedure Laterality Date   ??? HX CESAREAN SECTION     ??? HX CHOLECYSTECTOMY  2017       Social History     Social History     Socioeconomic History   ??? Marital status: SINGLE     Spouse name: Not on file   ??? Number of children: Not on file   ??? Years of education: Not on file   ??? Highest education level: Not on file   Tobacco Use   ??? Smoking status: Never Smoker   ??? Smokeless tobacco: Never Used   Substance and Sexual Activity   ??? Alcohol use: Not Currently       Family History   History reviewed. No pertinent family history.    Current Medications     None       Allergies     Allergies   Allergen Reactions   ??? Rocephin [Ceftriaxone] Anaphylaxis       Physical Exam     ED  Triage Vitals [07/25/17 0759]   ED Encounter Vitals Group      BP 118/73      Pulse (Heart Rate) 66      Resp Rate 18      Temp 97.7 ??F (36.5 ??C)      Temp src       O2 Sat (%) 100 %      Weight       Height        Physical Exam   Constitutional: She is oriented to person, place, and time and well-developed, well-nourished, and in no distress.   HENT:   Head: Normocephalic and atraumatic.   Right Ear: External ear normal.   Left Ear: External ear normal.   Nose: Nose normal.   Mouth/Throat: Oropharynx is clear and moist.   Eyes: Pupils are equal, round, and reactive to light. Conjunctivae and EOM are normal.   No nystagmus   Neck: Normal range of motion. Neck supple.   Cardiovascular: Normal rate, regular rhythm, normal heart sounds and intact distal pulses.   No murmur heard.   Pulmonary/Chest: Effort normal and breath sounds normal. No respiratory distress. She has no wheezes. She has no rales.   Abdominal: Soft. Bowel sounds are normal. She exhibits no distension. There is no tenderness.   Musculoskeletal: Normal range of motion. She exhibits no edema or deformity.   Neurological: She is alert and oriented to person, place, and time. No cranial nerve deficit. GCS score is 15.   Skin: Skin is warm and dry. No rash noted. No erythema.   Psychiatric: Mood, memory, affect and judgment normal.   Nursing note and vitals reviewed.      Impression and Management Plan   22 y.o. female presents with reported 5-week pregnancy and a fall that occurred last night with residual abdominal pain and cramping.  We will check a urinalysis and urine pregnancy test to confirm the pregnancy.  We will then get a transvaginal ultrasound to evaluate for any acute abnormalities with the pregnancy.    Diagnostic Studies   Lab:   No results found for this or any previous visit (from the past 12 hour(s)).    Imaging:  (All imaging personally reviewed by myself in conjunction with Carmela HurtFICKENSCHER, BEN A, MD)  Study Result     EARLY OBSTETRIC ULTRASOUND:  ??  INDICATION: Pelvic pain, pregnancy; Size, dates and viability.   ??  TECHNIQUE:  Transvaginal obstetric ultrasound, first trimester.  ??  FINDINGS:   There is a single gestational sac within the uterus with mean sac diameter of  1.79 cm. There is a normal appearing yolk sac.   ??  Fetal pole measures 9.16 mm. This corresponds to a 7 weeks and 0 day gestation.  The fetal heart rate is 158 bpm.   ??  Prominent venous vessels in the left adnexa, raising possibility for female  pelvic congestion syndrome.   ??  There is a subchorionic hemorrhage measuring approximately 2.3 cm. Small amount  of free fluid in the pelvic cul-de-sac.  ??  IMPRESSION  IMPRESSION:  1.  Single viable intrauterine pregnancy.  ??  2.  Subchorionic hemorrhage. Short-term follow-up is recommended.  ??   3.  Small amount of free fluid in the pelvic cul-de-sac.  ??  4.  Prominent pelvic adnexal venous vessels, raising possibility for female  pelvic congestion syndrome.     ED Course     No data found.    ED Course as of Jul 26 2107   Fri Jul 25, 2017   1024 1. ??Single viable intrauterine pregnancy.    2. ??Subchorionic hemorrhage. Short-term follow-up is recommended.    3. ??Small amount of free fluid in the pelvic cul-de-sac.    4. ??Prominent pelvic adnexal venous vessels, raising possibility for female  pelvic congestion syndrome.   [place] [facility] TRANSVAGINAL OB [SH]      ED Course User Index  [SH] Blima Rich, PA-C       Procedures  Medical Decision Making   Patient had a single viable intrauterine pregnancy.  I have advised the patient to call when she is discharged to follow-up with a OB/GYN because she needs to have a short-term follow-up as soon as possible for possible repeat of her ultrasound given the subchorionic hemorrhage seen.  There is also small amount of free fluid in the pelvic cul-de-sac.  I have warned the patient that at this early point in the pregnancy, miscarriages are quite common.  She is to return to the emergency room if she has any worsening of pain, cramping or bleeding.  I have provided the patient with a name of an OB/GYN in the area.    Final Diagnosis       ICD-10-CM ICD-9-CM   1. Pregnancy test performed, pregnancy confirmed Z32.01 V72.42   2. Fall, initial encounter W19.Lorne Skeens Z610.9       Disposition   Discharge to home    Blima Rich, MPA, PA-C  July 25, 2017  8:26 AM    The patient was personally evaluated by myself and Christiana Pellant, MD who agrees with the above assessment and plan.    My signature above authenticates this document and my orders, the final diagnosis(es), discharge prescription (s), and instructions in the Epic record. If you have any questions please contact [phone].     Nursing notes have been reviewed by the Physician/Advanced Practice  Clinician. Dragon medical dictation software was used for portions of this report. Unintended voice recognition errors may occur.

## 2017-07-25 NOTE — ED Notes (Signed)
11:18 AM  07/25/17     Discharge instructions given to Michelle GreetAlea Alvarado (name) with verbalization of understanding. Patient accompanied by patient.  Patient discharged with the following prescriptions There are no discharge medications for this patient.   . Patient discharged to Home (destination).      Tacy LearnJohn L Emilio, RN

## 2018-04-05 IMAGING — MR MR 3D RECON AT SCANNER
20 of 21 series · 20 of 21 positions shown · IV contrast (multihance)
Comparison: CT and ultrasound of 1 day prior.

CLINICAL DATA: Cholecystectomy 3 months ago with abdominal pain.
Nausea and vomiting. Elevated liver function tests.

EXAM:
MRI ABDOMEN WITHOUT AND WITH CONTRAST (INCLUDING MRCP)
TECHNIQUE: Multiplanar multisequence MR imaging of the abdomen was performed
both before and after the administration of intravenous contrast.
Heavily T2-weighted images of the biliary and pancreatic ducts were
obtained, and three-dimensional MRCP images were rendered by post
processing.
CONTRAST:  20mL MULTIHANCE GADOBENATE DIMEGLUMINE 529 MG/ML IV SOLN

[Series 4: T2 fat-sat · axial · 5.0mm · 0.90mm/px · 1 of 56 slices shown]
[im 1/56]
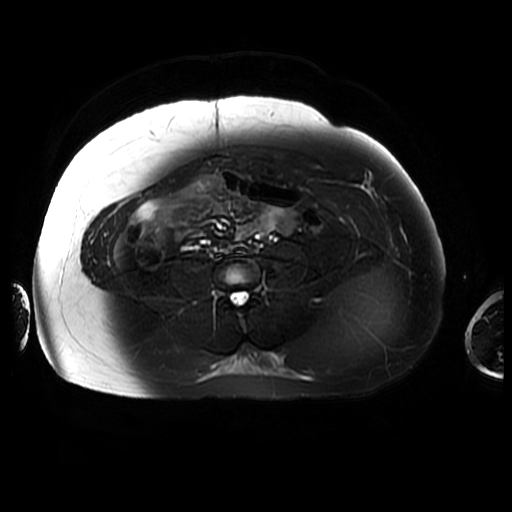

[Series 5: MRCP · coronal · 1.6mm · 0.62mm/px · 1 of 129 slices shown (1 of 3)]
[im 1/129]
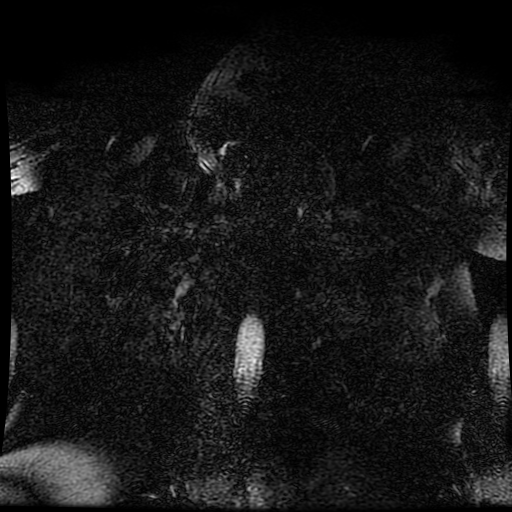

[Series 6: MRCP · coronal · 2.0mm · 0.70mm/px · 1 of 55 slices shown (2 of 3)]
[im 1/55]
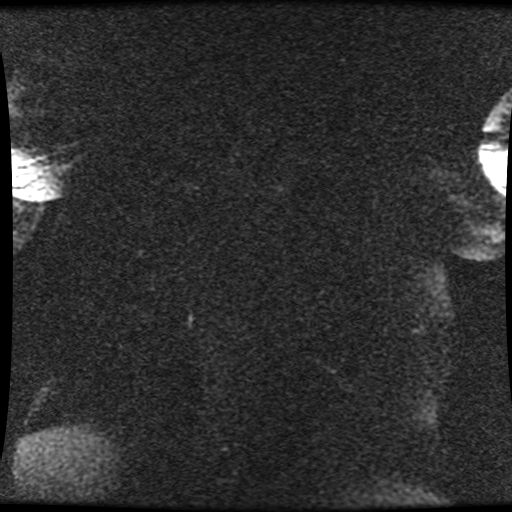

[Series 7: DWI b500 · axial · 6.0mm · 1.72mm/px · 1 of 78 slices shown]
[im 1/78]
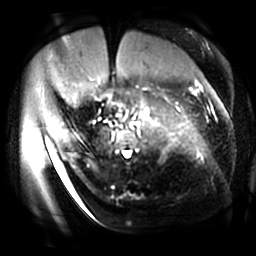

[Series 8: ax dualecho · axial · 5.0mm · 0.90mm/px · 1 of 112 slices shown]
[im 1/112]
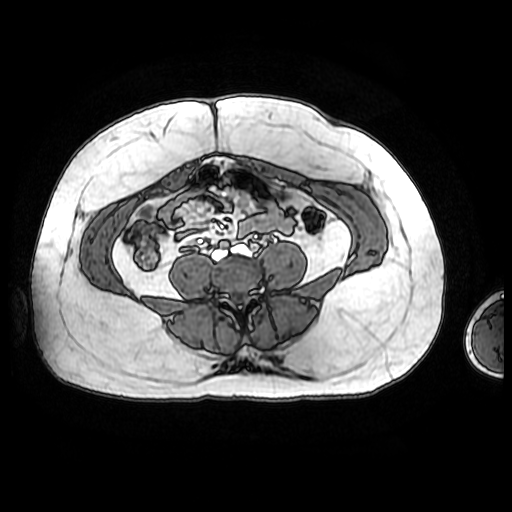

[Series 9: MRCP · coronal · 40.0mm · 0.70mm/px · 1 of 4 slices shown (3 of 3)]
[im 1/4]
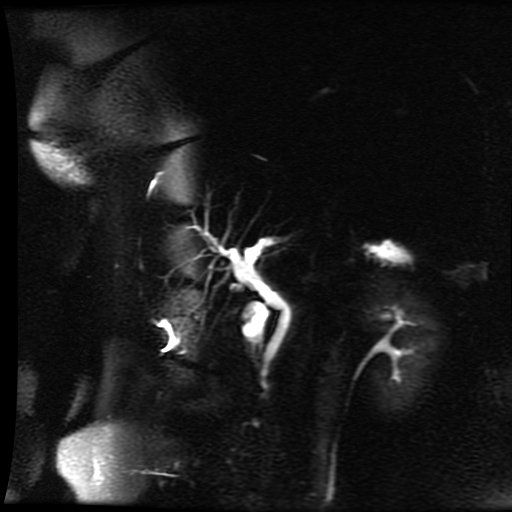

[Series 10: bSSFP fat-sat · coronal · 5.0mm · 0.70mm/px · 1 of 38 slices shown]
[im 1/38]
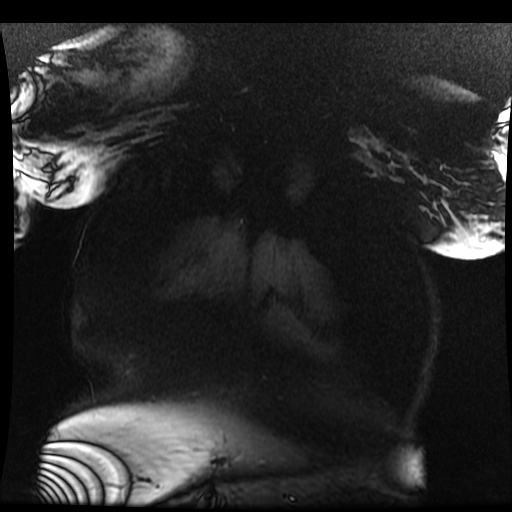

[Series 13: T1 dynamic · coronal · delayed · 8.0mm · 0.78mm/px · 1 of 80 slices shown (1 of 6)]
[im 1/80]
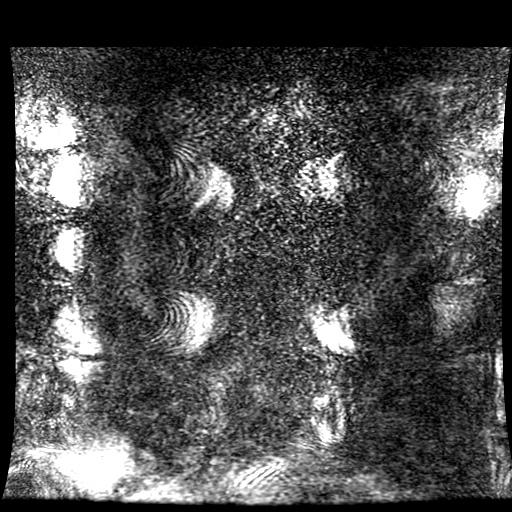

[Series 500: reformatted · axial · 1.6mm · 0.62mm/px · 1 of 42 slices shown]
[im 1/42]
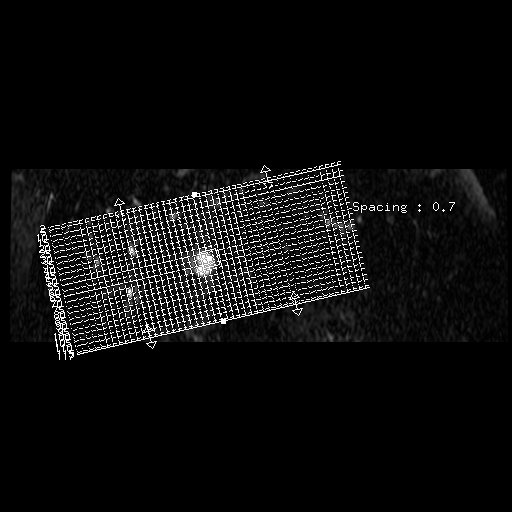

[Series 700: DWI · axial · 6.0mm · 1.72mm/px · 1 of 39 slices shown]
[im 1/39]
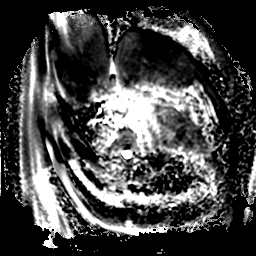

[Series 1201: T1 dynamic · axial · 5.8mm · 0.78mm/px · 1 of 88 slices shown (2 of 6)]
[im 1/88]
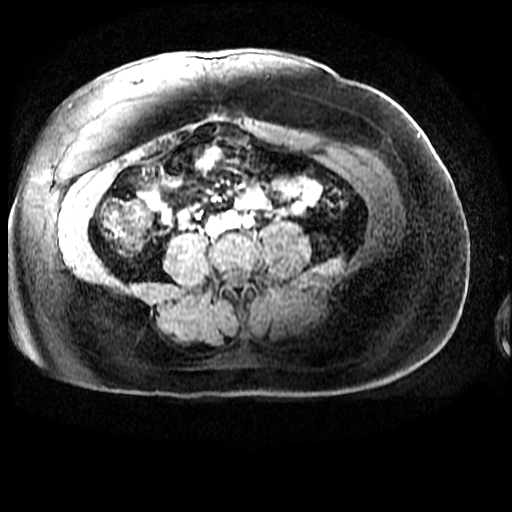

[Series 1202: T1 dynamic · axial · 5.8mm · 0.78mm/px · 1 of 88 slices shown (3 of 6)]
[im 1/88]
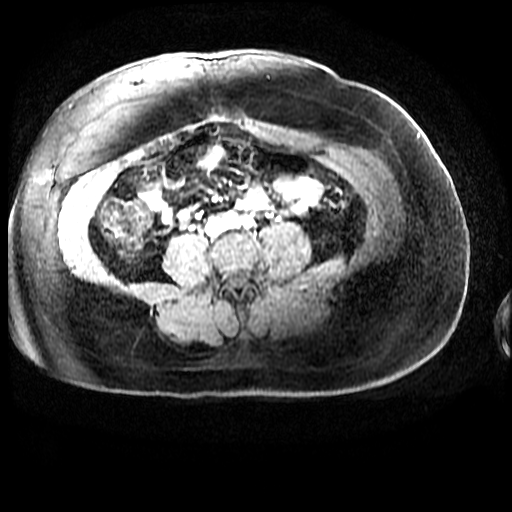

[Series 1203: T1 dynamic · axial · 5.8mm · 0.78mm/px · 1 of 88 slices shown (4 of 6)]
[im 1/88]
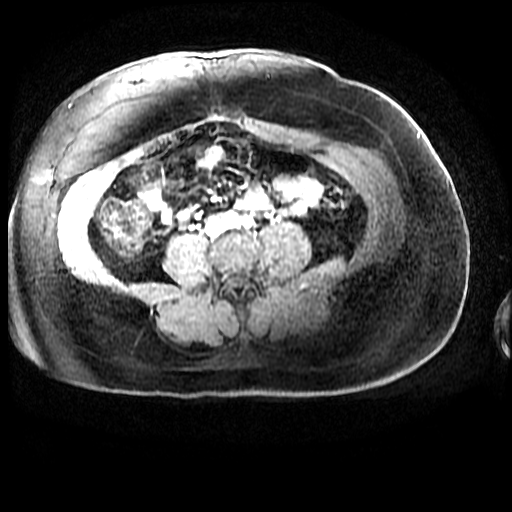

[Series 1204: T1 dynamic · axial · 5.8mm · 0.78mm/px · 1 of 88 slices shown (5 of 6)]
[im 1/88]
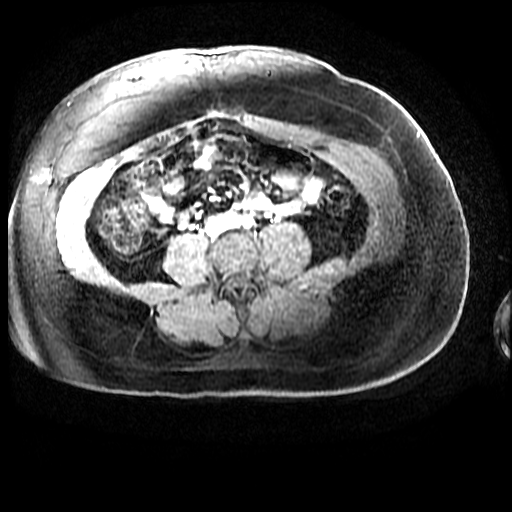

[Series 1205: T1 dynamic · axial · 5.8mm · 0.78mm/px · 1 of 88 slices shown (6 of 6)]
[im 1/88]
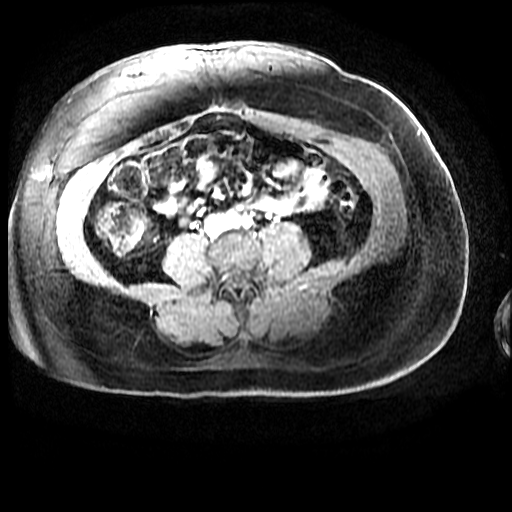

[((id)/(id)/1)-((id)/(id)/1) · axial · 5.8mm · 0.78mm/px · 1 of 88 slices shown (1 of 5)]
[im 1/88]
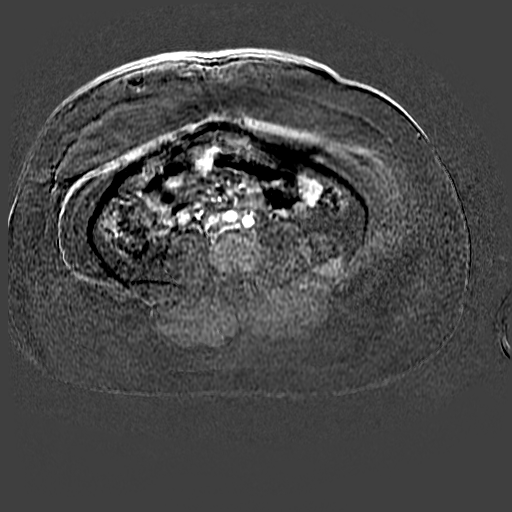

[((id)/(id)/1)-((id)/(id)/1) · axial · 5.8mm · 0.78mm/px · 1 of 88 slices shown (2 of 5)]
[im 1/88]
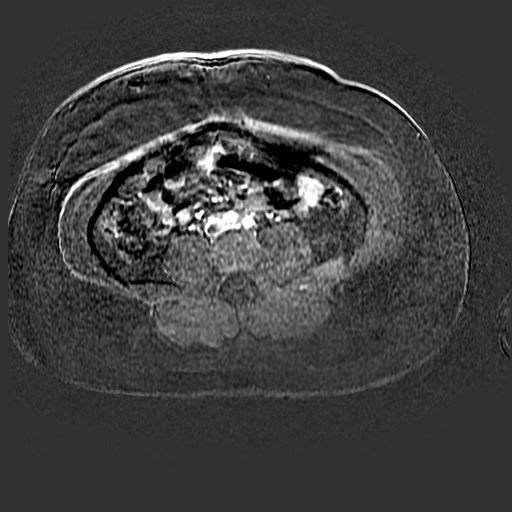

[((id)/(id)/1)-((id)/(id)/1) · axial · 5.8mm · 0.78mm/px · 1 of 88 slices shown (3 of 5)]
[im 1/88]
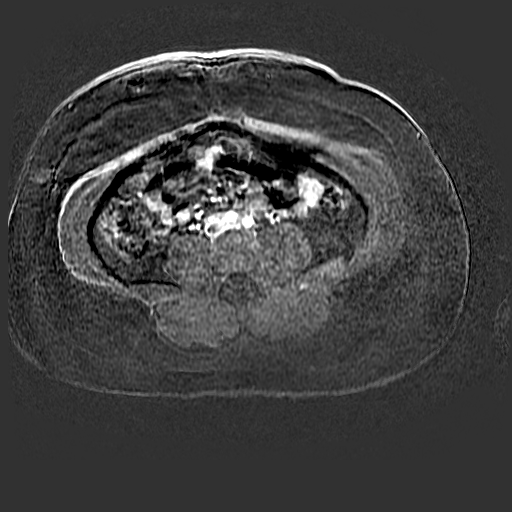

[((id)/(id)/1)-((id)/(id)/1) · axial · 5.8mm · 0.78mm/px · 1 of 88 slices shown (4 of 5)]
[im 1/88]
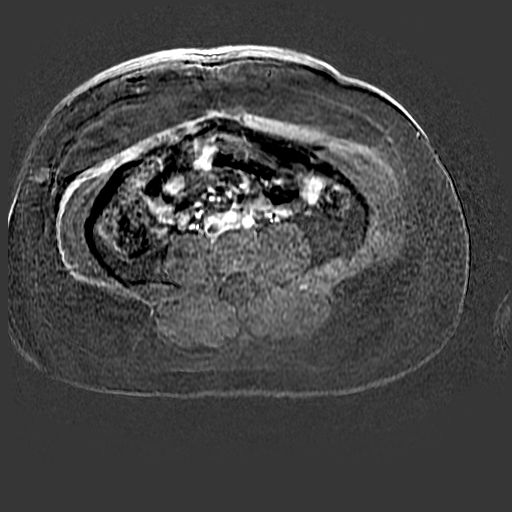

[((id)/(id)/1)-((id)/(id)/1) · axial · 5.8mm · 0.78mm/px · 1 of 88 slices shown (5 of 5)]
[im 1/88]
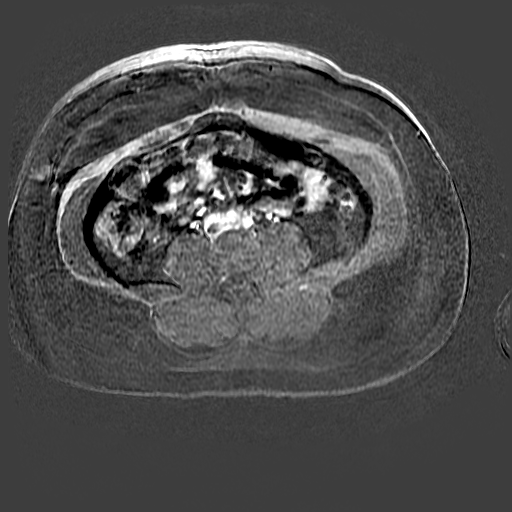

[20 of 21 positions shown; findings below may reference images not displayed]

FINDINGS: Lower chest: Normal heart size without pericardial or pleural
effusion.

Hepatobiliary: Moderate hepatomegaly at 19.2 cm craniocaudal. Marked
hepatic steatosis. No focal liver lesion.

Cholecystectomy. Mild intrahepatic ductal dilatation after
cholecystectomy. Example 7 mm left hepatic duct on image 23/series
4. The common duct is unremarkable after cholecystectomy. Example at
8 mm in the porta hepatis on image 71/ series 5. Tapers minimally
within the pancreatic head. No choledocholithiasis.

Pancreas: Normal, without mass or ductal dilatation.

Spleen: Normal in size, without focal abnormality.

Adrenals/Urinary Tract: Normal adrenal glands. Normal kidneys,
without hydronephrosis.

Stomach/Bowel: Normal stomach and abdominal bowel loops.

Vascular/Lymphatic: Normal caliber of the aorta and branch vessels.
No retroperitoneal or retrocrural adenopathy.

Other: No ascites.

Musculoskeletal: No acute osseous abnormality.
IMPRESSION: 1. Status post cholecystectomy. Mild intrahepatic ductal dilatation,
without cause identified. No evidence of common duct dilatation or
stone.
2. Hepatomegaly and marked hepatic steatosis.

## 2018-04-07 IMAGING — RF DG ERCP WO/W SPHINCTEROTOMY
1 series · 4 of 4 positions shown · non-contrast
Comparison: MR 09/19/2015, CT 09/18/2015

CLINICAL DATA: 20-year-old female with a history of possible
choledocholithiasis

EXAM:
ERCP
TECHNIQUE: Multiple spot images obtained with the fluoroscopic device and
submitted for interpretation post-procedure.
FLUOROSCOPY TIME:  9 minutes 38 seconds

[Series 1: run · 4 of 4 slices shown]
[im 1/4]
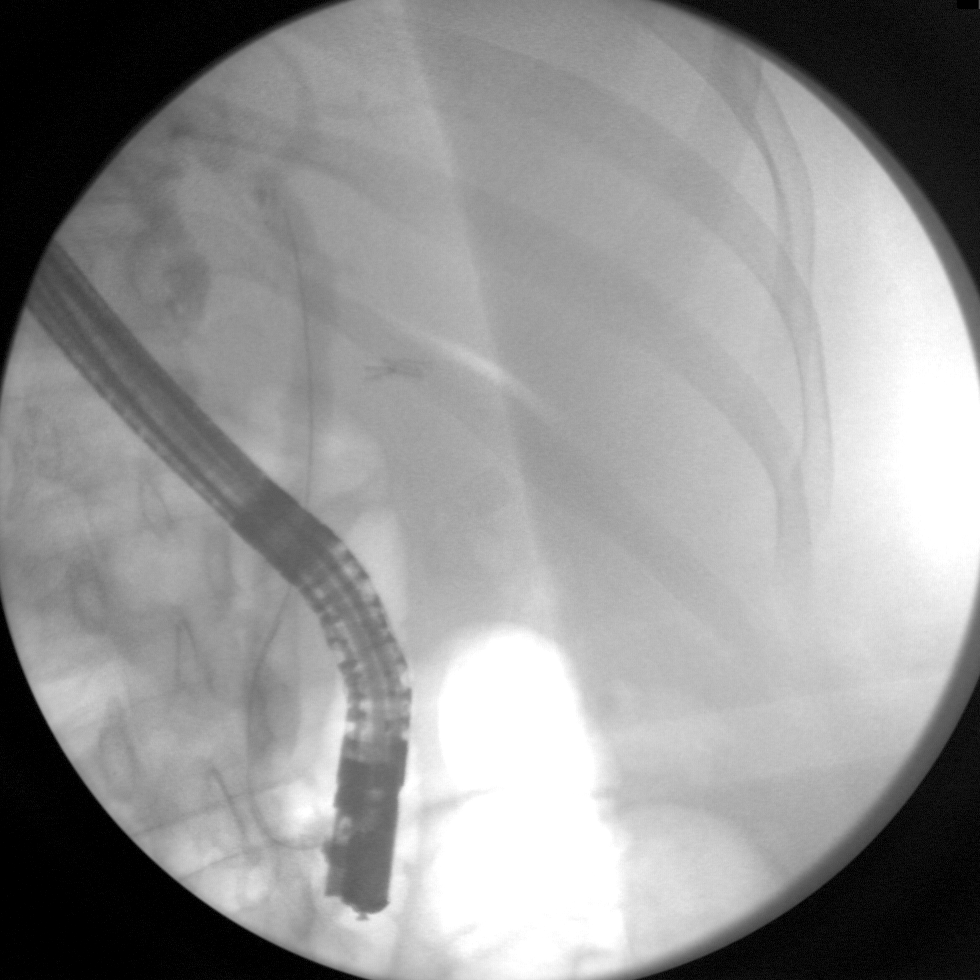
[im 2/4]
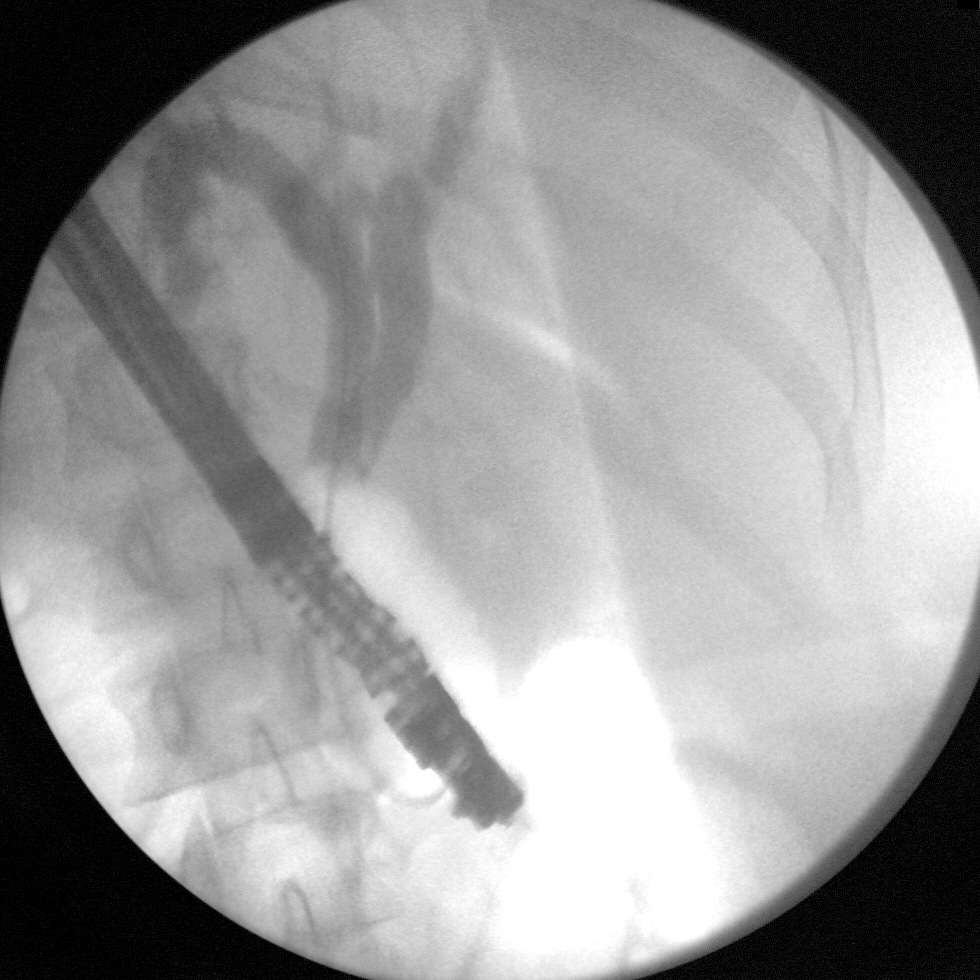
[im 3/4]
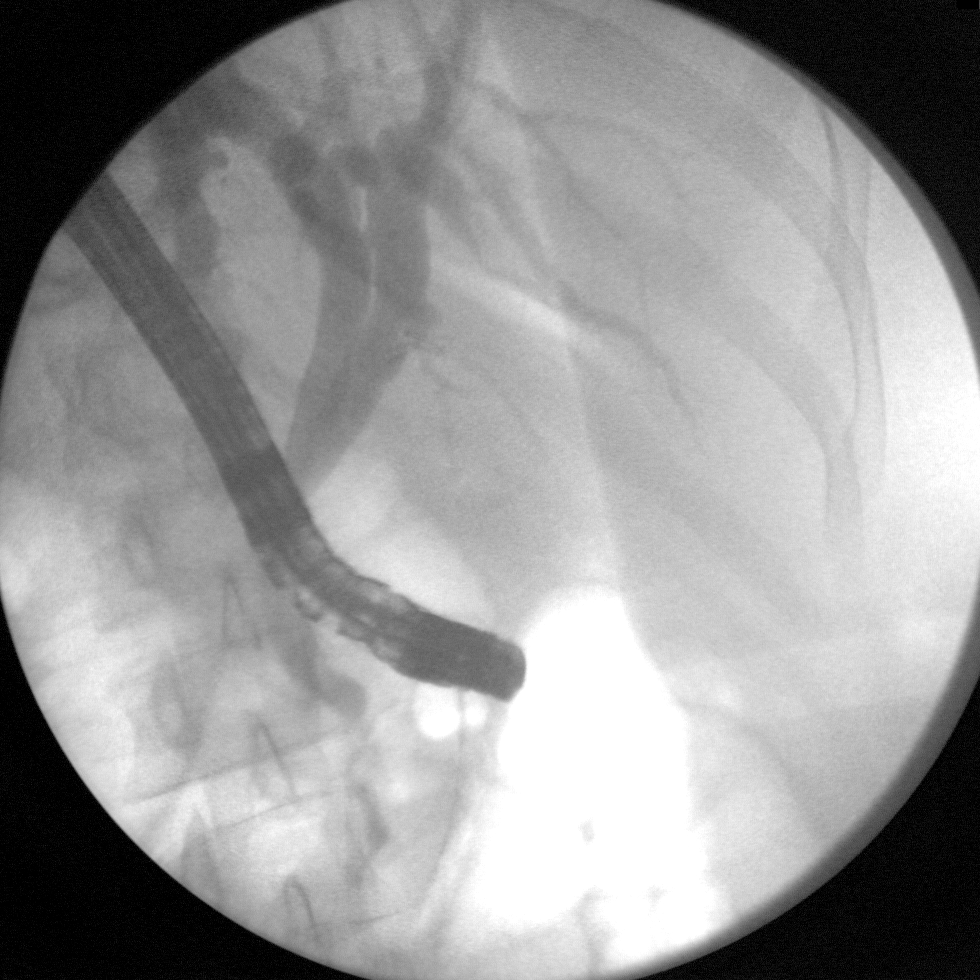
[im 4/4]
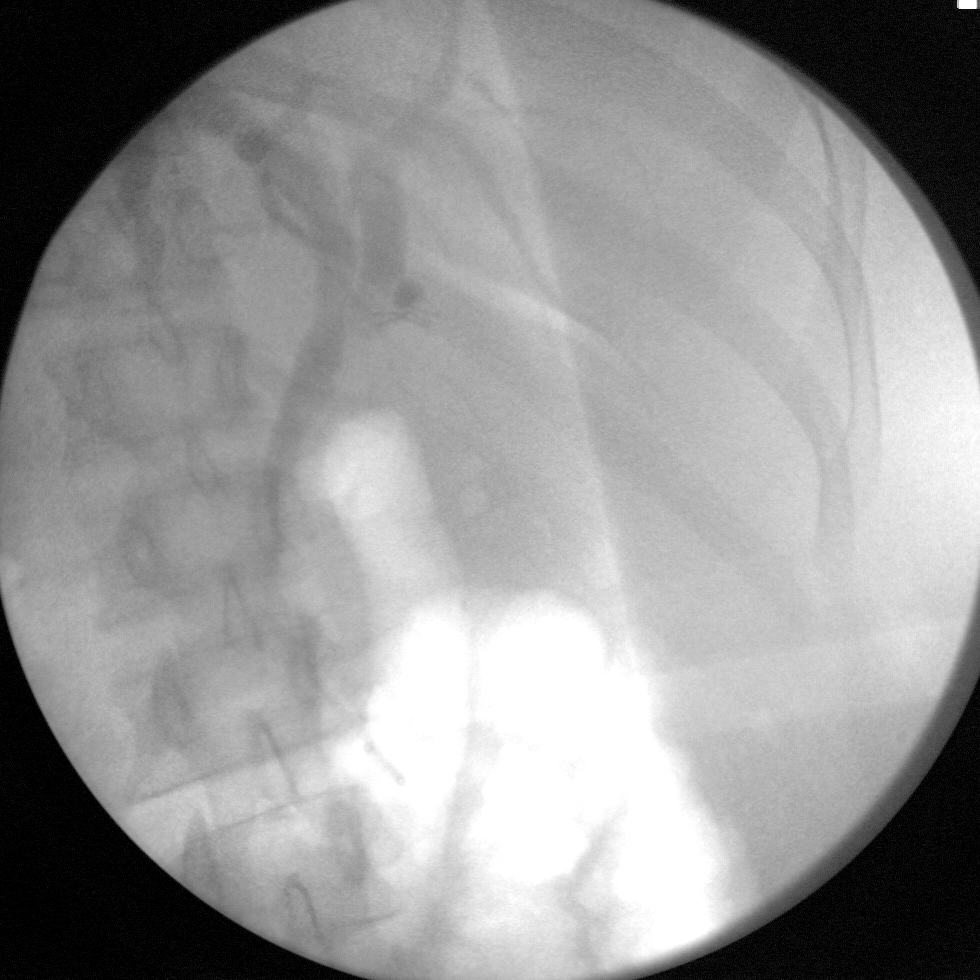

[4 of 4 positions shown; findings below may reference images not displayed]

FINDINGS: Limited intraoperative fluoroscopic spot images during ERCP.

Initial image demonstrates scope projecting over the upper abdomen
with cannulation of the ampulla.

Subsequently there is retrograde infusion of contrast within the
extrahepatic biliary ducts.

A balloon basket is visualized within the ducts. Incomplete
visualization of the extrahepatic biliary ducts.

Final image demonstrates apparent biliary stent placement.
IMPRESSION: Limited images during ERCP, with partial visualization of the
extrahepatic ducts an apparent biliary stent placement.

These images were submitted for radiologic interpretation only.
Please see the procedural report for the amount of contrast,
procedural findings, and the fluoroscopy time utilized.

## 2018-07-30 ENCOUNTER — Encounter: Payer: Self-pay | Admitting: *Deleted
